# Patient Record
Sex: Female | Born: 1970 | Race: Black or African American | Hispanic: No | Marital: Married | Smoking: Never smoker
Health system: Southern US, Community
[De-identification: ages and names within clinical notes are randomized; demographics above are authoritative.]

## PROBLEM LIST (undated history)

## (undated) DIAGNOSIS — K604 Rectal fistula, unspecified: Secondary | ICD-10-CM

## (undated) DIAGNOSIS — K649 Unspecified hemorrhoids: Secondary | ICD-10-CM

## (undated) DIAGNOSIS — R519 Headache, unspecified: Secondary | ICD-10-CM

## (undated) DIAGNOSIS — F419 Anxiety disorder, unspecified: Secondary | ICD-10-CM

## (undated) DIAGNOSIS — G8929 Other chronic pain: Secondary | ICD-10-CM

## (undated) HISTORY — DX: Anxiety disorder, unspecified: F41.9

## (undated) HISTORY — DX: Headache, unspecified: R51.9

## (undated) HISTORY — DX: Other chronic pain: G89.29

## (undated) HISTORY — DX: Rectal fistula, unspecified: K60.40

## (undated) HISTORY — DX: Unspecified hemorrhoids: K64.9

---

## 1999-04-08 ENCOUNTER — Other Ambulatory Visit: Admission: RE | Admit: 1999-04-08 | Discharge: 1999-04-08 | Payer: Self-pay | Admitting: Obstetrics and Gynecology

## 2000-06-14 ENCOUNTER — Other Ambulatory Visit: Admission: RE | Admit: 2000-06-14 | Discharge: 2000-06-14 | Payer: Self-pay | Admitting: Obstetrics and Gynecology

## 2001-05-28 ENCOUNTER — Other Ambulatory Visit: Admission: RE | Admit: 2001-05-28 | Discharge: 2001-05-28 | Payer: Self-pay | Admitting: Obstetrics and Gynecology

## 2002-01-10 ENCOUNTER — Inpatient Hospital Stay (HOSPITAL_COMMUNITY): Admission: AD | Admit: 2002-01-10 | Discharge: 2002-01-14 | Payer: Self-pay | Admitting: Gynecology

## 2002-01-11 ENCOUNTER — Encounter (INDEPENDENT_AMBULATORY_CARE_PROVIDER_SITE_OTHER): Payer: Self-pay | Admitting: Specialist

## 2002-02-22 ENCOUNTER — Other Ambulatory Visit: Admission: RE | Admit: 2002-02-22 | Discharge: 2002-02-22 | Payer: Self-pay | Admitting: Gynecology

## 2003-03-18 ENCOUNTER — Other Ambulatory Visit: Admission: RE | Admit: 2003-03-18 | Discharge: 2003-03-18 | Payer: Self-pay | Admitting: Obstetrics and Gynecology

## 2004-04-14 ENCOUNTER — Other Ambulatory Visit: Admission: RE | Admit: 2004-04-14 | Discharge: 2004-04-14 | Payer: Self-pay | Admitting: Obstetrics and Gynecology

## 2005-01-17 ENCOUNTER — Ambulatory Visit (HOSPITAL_COMMUNITY): Admission: RE | Admit: 2005-01-17 | Discharge: 2005-01-17 | Payer: Self-pay | Admitting: Obstetrics and Gynecology

## 2005-01-17 IMAGING — US US OB COMP LESS 14 WK
1 series · 14 of 28 positions shown · non-contrast
Comparison: none

CLINICAL DATA: 7 week 4 day gestational age by LMP.  Vaginal bleeding.  
 OBSTETRICAL ULTRASOUND <14 WKS AND TRANSVAGINAL OB US:
TECHNIQUE: Both transabdominal and transvaginal ultrasound examinations were performed for complete evaluation of the gestation as well as the maternal uterus, adnexal regions, and pelvic cul-de-sac.
 A single living intrauterine gestation is seen with measured heart rate of 163.  Embryonic crown rump length measures 1.6 cm, corresponding with a gestational age of 7 weeks 6 days.  A normal appearing yolk sac is seen.  There is no evidence of subchorionic hemorrhage.  No fibroids or other maternal uterine abnormalities are identified.  
 The right ovary is normal in appearance.  The left ovary contains a small corpus luteum cyst measuring approximately 2 cm.  There is also a simple left paraovarian cyst measuring 2.2 cm and a small amount of free fluid.

[Series 1: us ob comp less 14 wk · 0.29mm/px · 14 of 39 slices shown]
[im 2/39]
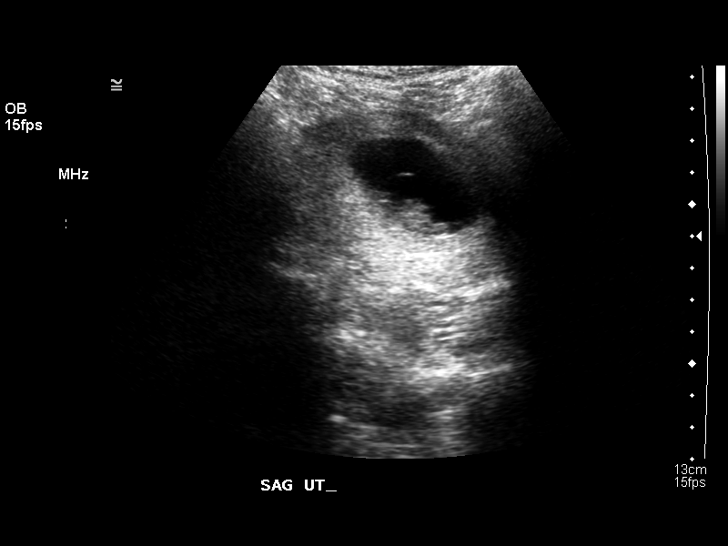
[im 5/39]
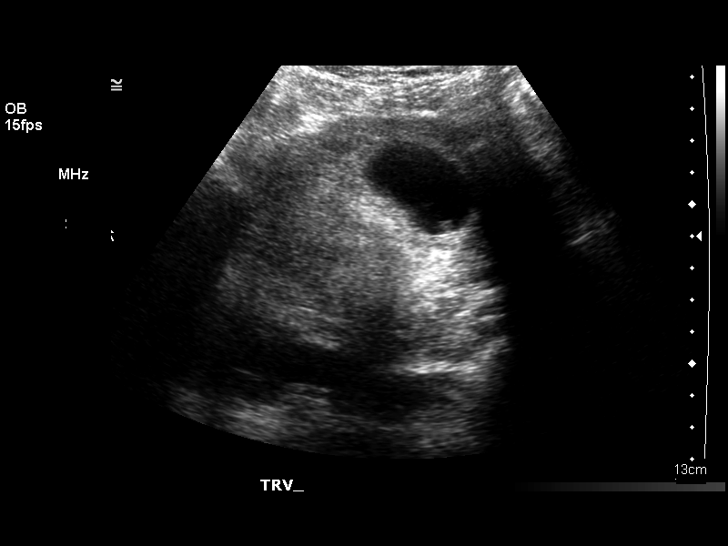
[im 8/39]
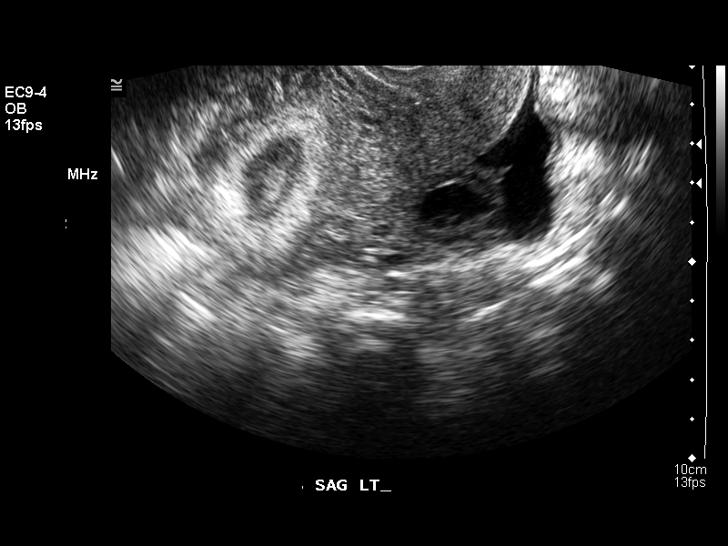
[im 10/39]
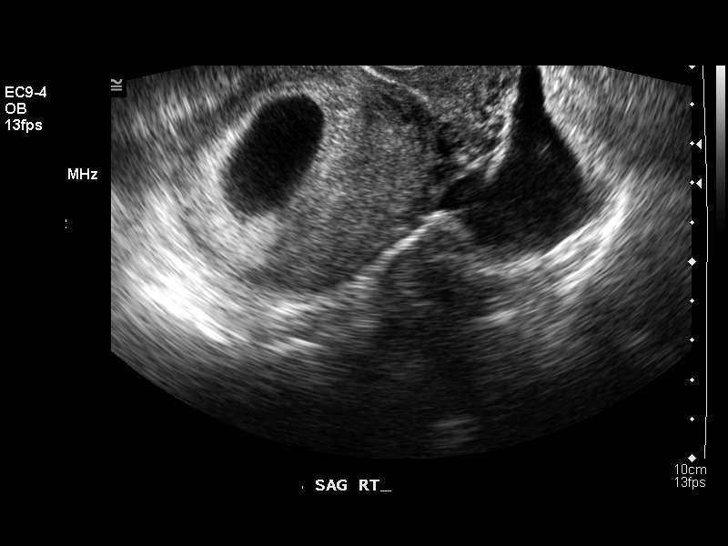
[im 13/39]
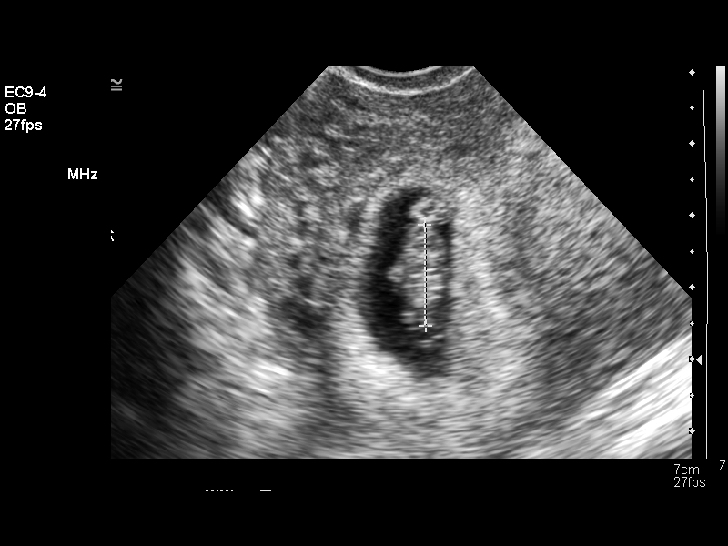
[im 16/39]
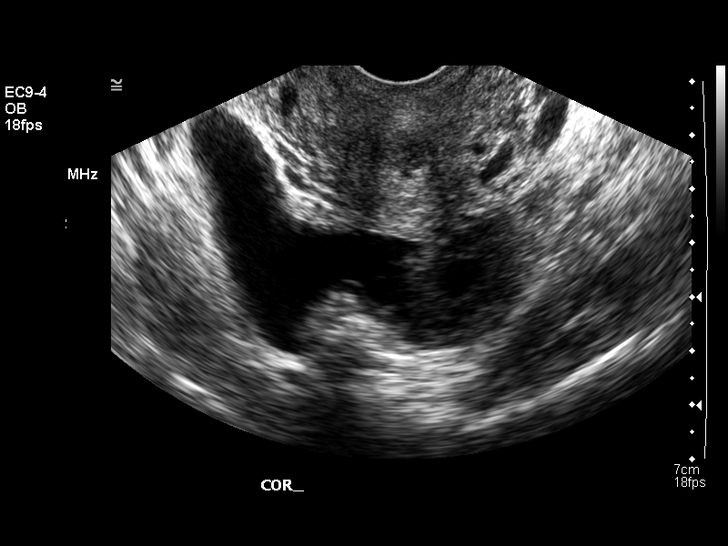
[im 19/39]
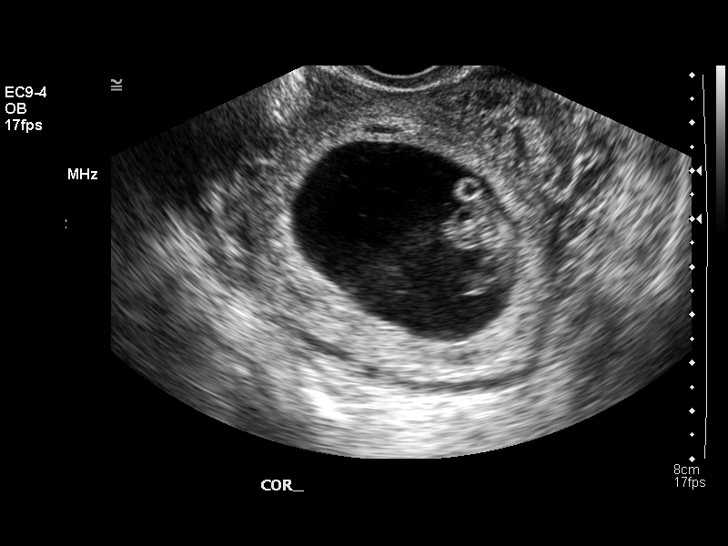
[im 22/39]
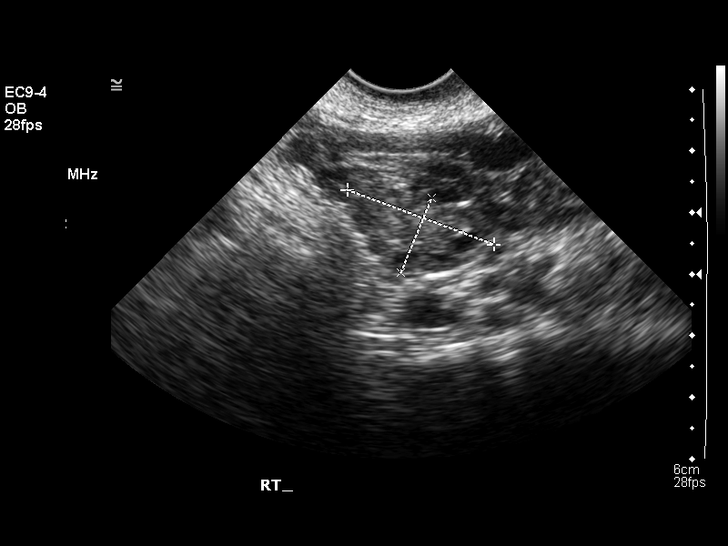
[im 24/39]
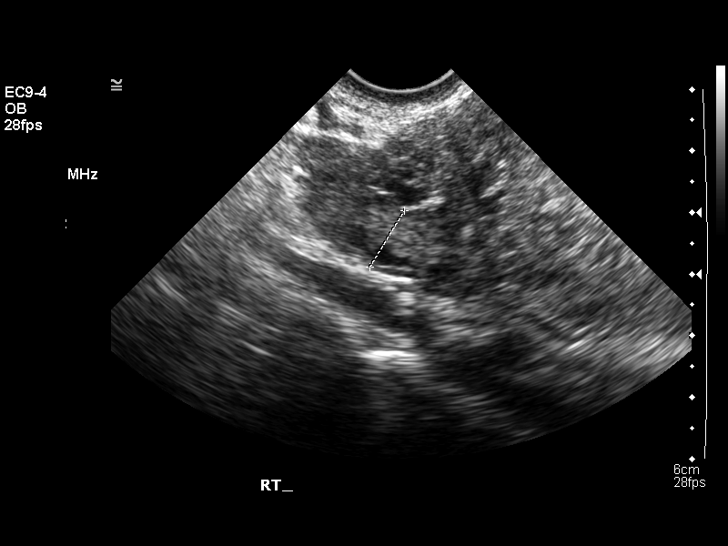
[im 27/39]
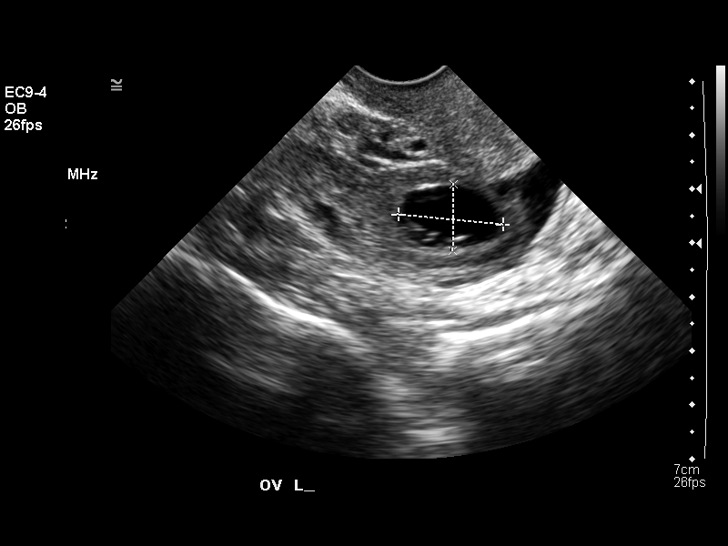
[im 30/39]
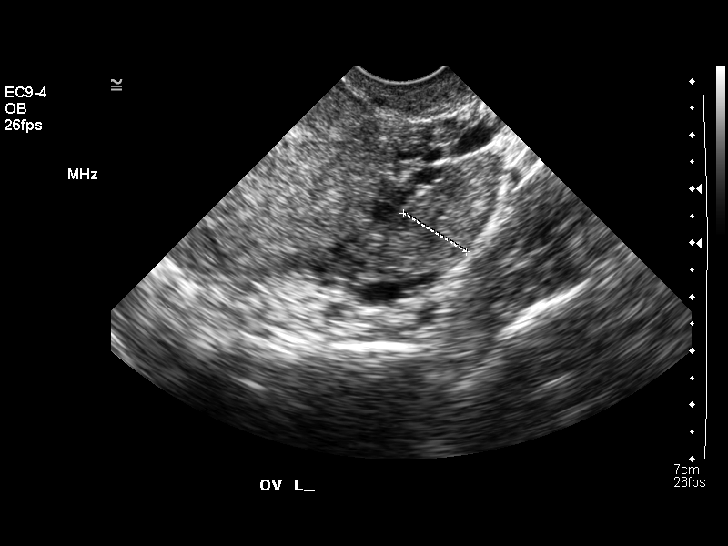
[im 33/39]
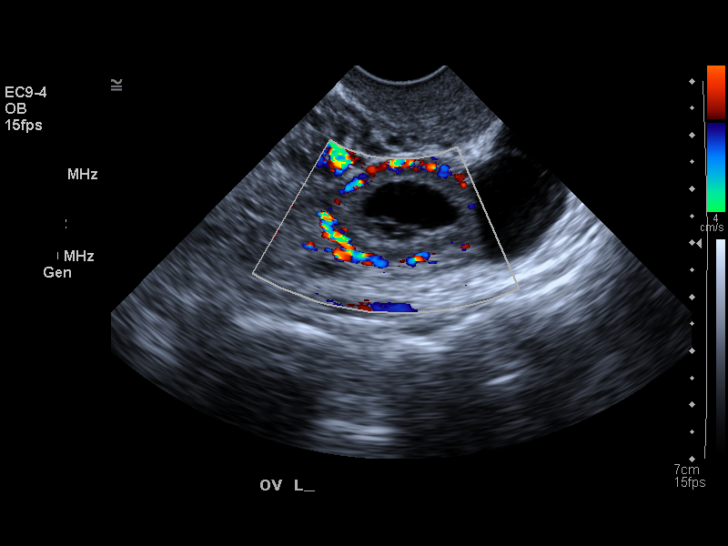
[im 36/39]
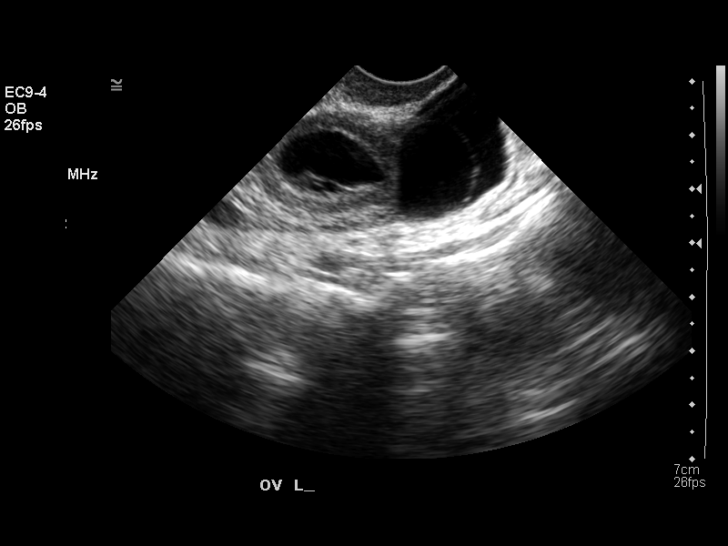
[im 39/39]
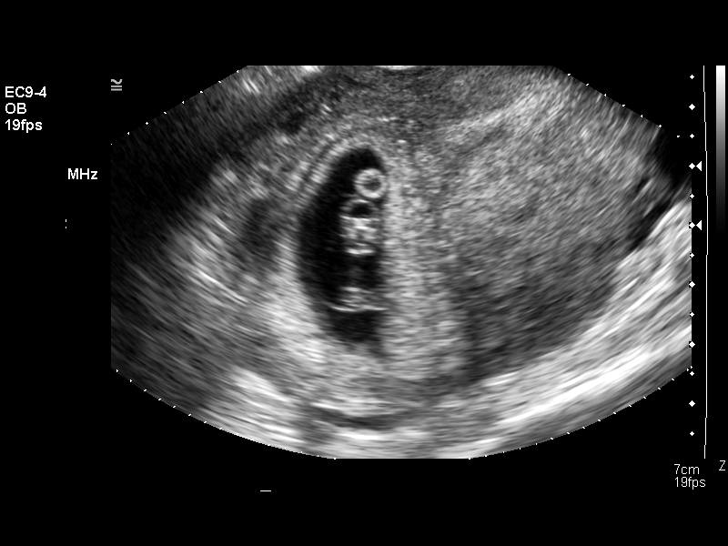

[14 of 28 positions shown; findings below may reference images not displayed]

IMPRESSION: 1.  Single living intrauterine gestation with estimated gestational age of 7 weeks [LD] and sonographic EDC of [DATE]
 2.   2 cm left ovarian corpus luteum cyst and 2.2 cm left paraovarian cyst.   
 3.   Normal appearance of the right ovary.

## 2005-02-07 ENCOUNTER — Other Ambulatory Visit: Admission: RE | Admit: 2005-02-07 | Discharge: 2005-02-07 | Payer: Self-pay | Admitting: Gynecology

## 2005-08-25 ENCOUNTER — Inpatient Hospital Stay (HOSPITAL_COMMUNITY): Admission: RE | Admit: 2005-08-25 | Discharge: 2005-08-28 | Payer: Self-pay | Admitting: Gynecology

## 2005-10-19 ENCOUNTER — Other Ambulatory Visit: Admission: RE | Admit: 2005-10-19 | Discharge: 2005-10-19 | Payer: Self-pay | Admitting: Gynecology

## 2007-01-15 ENCOUNTER — Other Ambulatory Visit: Admission: RE | Admit: 2007-01-15 | Discharge: 2007-01-15 | Payer: Self-pay | Admitting: Obstetrics and Gynecology

## 2008-01-17 ENCOUNTER — Encounter: Payer: Self-pay | Admitting: Obstetrics and Gynecology

## 2008-01-17 ENCOUNTER — Ambulatory Visit: Payer: Self-pay | Admitting: Obstetrics and Gynecology

## 2008-01-17 ENCOUNTER — Other Ambulatory Visit: Admission: RE | Admit: 2008-01-17 | Discharge: 2008-01-17 | Payer: Self-pay | Admitting: Obstetrics and Gynecology

## 2009-01-19 ENCOUNTER — Encounter: Payer: Self-pay | Admitting: Obstetrics and Gynecology

## 2009-01-19 ENCOUNTER — Other Ambulatory Visit: Admission: RE | Admit: 2009-01-19 | Discharge: 2009-01-19 | Payer: Self-pay | Admitting: Obstetrics and Gynecology

## 2009-01-19 ENCOUNTER — Ambulatory Visit: Payer: Self-pay | Admitting: Obstetrics and Gynecology

## 2010-01-20 ENCOUNTER — Ambulatory Visit: Payer: Self-pay | Admitting: Obstetrics and Gynecology

## 2010-01-20 ENCOUNTER — Other Ambulatory Visit: Admission: RE | Admit: 2010-01-20 | Discharge: 2010-01-20 | Payer: Self-pay | Admitting: Obstetrics and Gynecology

## 2010-03-24 ENCOUNTER — Encounter
Admission: RE | Admit: 2010-03-24 | Discharge: 2010-03-24 | Payer: Self-pay | Source: Home / Self Care | Attending: Obstetrics and Gynecology | Admitting: Obstetrics and Gynecology

## 2010-07-23 NOTE — Op Note (Signed)
NAME:  Natalie Meyer, Natalie Meyer                     ACCOUNT NO.:  [id number]   MEDICAL RECORD NO.:  [id number]                   PATIENT TYPE:  INP   LOCATION:  9199                                 [facility]ACILITY:  [facility]   PHYSICIAN:  Katy Fitch, M.D.               DATE OF BIRTH:  September 01, 1970   DATE OF PROCEDURE:  01/11/2002  DATE OF DISCHARGE:                                 OPERATIVE REPORT   PREOPERATIVE DIAGNOSES:  1. Forty-one week intrauterine pregnancy.  2. Repetitive bradycardia.   POSTOPERATIVE DIAGNOSES:  1. Forty-one week intrauterine pregnancy.  2. Repetitive bradycardia.   PROCEDURE:  Stat primary low transverse cesarean section.   SURGEON:  Katy Fitch, M.D.   ASSISTANT:  Scrub technician.   ANESTHESIA:  Spinal.   ESTIMATED BLOOD LOSS:  600.   URINE OUTPUT:  225 and clear.   FLUIDS REPLACED:  2 L crystalloid.   COMPLICATIONS:  None.   SPECIMENS:  1. Cord pH.  2. Cord blood.  3. Placenta.   FINDINGS:  A living 8 pound 14 ounce female in the occiput posterior position  with Apgars of 9 and 9, with three-vessel cord.  Normal-appearing uterus,  tubes, and ovaries.  Cord pH noted to be 7.13.   DESCRIPTION OF PROCEDURE:  The patient was taken to the operating room after  noted to have a 12-minute bradycardia into the 70s without recovery in labor  room despite positioning, oxygenation, and checking for cord.  The patient  was taken back to the operating room in an emergent fashion.  The patient  had spinal anesthesia administered.  The patient was then prepped and draped  in a sterile fashion.  A skin incision was made, Pfannenstiel fashion, and  was taken down to the fascia and incised.  The fascia was then extended  laterally digitally.  the rectus muscles were then separated in the midline  digitally.  The peritoneum was then entered bluntly using fingers.  A  bladder blade was then placed into the pelvis.  The vesicouterine peritoneum  was identified and  incised with Metzenbaum scissors, a bladder flap was  created digitally, and then the bladder blade was placed back in.  A low  transverse uterine incision was made with a scalpel and extended manually.  The infant's head was then delivered atraumatically along with the rest of  the body.  The mouth and nose were suctioned, the cord was clamped and cut,  and the infant handed off to the waiting NICU attendants.  Cord pH was  obtained along with cord blood, and the placenta was massaged from the  uterus and sent for pathology.  The uterus was then exteriorized and cleared  of all clots and debris.  A running 1-0 chromic interlocking stitch was used  to close the lower uterine segment.  A second imbricating stitch was then  used to make the incision hemostatic.  Pressure was placed on the incision,  and the posterior cul-de-sac was irrigated of all clots and debris with warm  normal saline.  The uterus was then returned to the pelvis.  The cul-de-sacs  were then irrigated and removed of all clots and debris.  The incision was  noted to be hemostatic.  Subfascial inspection of the incision was noted to  be hemostatic.  The rectus muscles were reapproximated with one interrupted  stitch of 1 chromic.  The fascia was then reapproximated using 0 Vicryl in a  running continuous stitch.  The subcutaneous tissue was irrigated and Bovie  cautery of several areas of bleeding.  The skin was then reapproximated  using staples.  The patient was taken to the recovery room in stable  condition.                                               Katy Fitch, M.D.    DC/MEDQ  D:  01/11/2002  T:  01/11/2002  Job:  [id number]

## 2010-07-23 NOTE — Discharge Summary (Signed)
   NAME:  Natalie, Meyer                     ACCOUNT NO.:  [id number]   MEDICAL RECORD NO.:  [id number]                   PATIENT TYPE:  INP   LOCATION:  9132                                 [facility]ILITY:  [facility]   PHYSICIAN:  Ivor Costa. Farrel Gobble, M.D.              DATE OF BIRTH:  31-Dec-1970   DATE OF ADMISSION:  01/11/2002  DATE OF DISCHARGE:  01/14/2002                                 DISCHARGE SUMMARY   DISCHARGE DIAGNOSES:  1. Intrauterine pregnancy 41 weeks.  2. Repetitive bradycardia.   PROCEDURE:  Stat primary low transverse cesarean section with delivery of  viable infant.   HISTORY OF PRESENT ILLNESS:  The patient is a 40 year old primigravida with  an LMP of March 30, 2001, [facility] January 04, 2002.  Prenatal course was  uncomplicated.  Laboratories:  Blood type A+.  Antibody screen negative.  Sickle cell negative.  RPR, HBSAG, HIV nonreactive.   HOSPITAL COURSE:  The patient presented at 41 weeks for induction of labor  secondary to post dates.  Labor was initiated with Cervidil followed by high  dose Pitocin.  The patient did develop repetitive bradycardia during the  course of her labor.  Delivery was accomplished by stat primary low  transverse cesarean section with delivery of viable 8 pound 14 ounce female,  Apgars 9/9.  Normal appearing uterus, tubes, and ovaries.  Cord pH 7.13.  Postoperative course patient remained afebrile.  Had no difficulty voiding.  Was able to be discharged in satisfactory condition on her third  postoperative day.  CBC:  Hematocrit 26, hemoglobin 9, WBC 12.5, platelets  161,000.   DISPOSITION:  Follow up six weeks.  Prenatal vitamins and iron.  Tylox for  pain.     [name], N.P.                Ivor Costa. Farrel Gobble, M.D.    MKH/MEDQ  D:  02/05/2002  T:  02/05/2002  Job:  [id number]

## 2010-07-23 NOTE — H&P (Signed)
   NAME:  Natalie Meyer, HUERTAS                     ACCOUNT NO.:  [id number]   MEDICAL RECORD NO.:  [id number]                   PATIENT TYPE:   LOCATION:                                       [facility]   PHYSICIAN:  Ivor Costa. Farrel Gobble, M.D.              DATE OF BIRTH:  April 16, 1970   DATE OF ADMISSION:  01/10/2002  DATE OF DISCHARGE:                                HISTORY & PHYSICAL   CHIEF COMPLAINT:  At 41-1/7 weeks pregnancy.   HISTORY OF PRESENT ILLNESS:  The patient is a 40 year old G1 with an LMP of  March 30, 2001, estimated date of confinement of January 04, 2002,  estimated gestational age of 41-1/7 weeks who presents to labor and delivery  for elective induction of labor for postdates pregnancy.  Pregnancy has  otherwise been without complication.  The patient reports good fetal  movement, no vaginal bleeding and no loss of fluid.  She is A positive,  antibody negative, RPR nonreactive, rubella immune, hepatitis B surface  antigen nonreactive, HIV nonreactive, GBS negative.  Pregnancy is confirmed  by an 8-week ultrasound.   PAST OBSTETRICAL/GYNECOLOGICAL HISTORY:  Unremarkable with normal 28-day  cycles, normal Pap smear.   MEDICAL HISTORY:  Negative.   SURGICAL HISTORY:  Negative.   ALLERGIES:  Negative.   SOCIAL HISTORY:  She is married; no alcohol or tobacco.   PHYSICAL EXAMINATION:  GENERAL: She is a gravid female in no acute distress.  VITAL SIGNS: Blood pressure is 116/70.  Weight gain is 50 pounds.  HEENT: Unremarkable.  NECK: Thyroid smooth, nontender, and mobile.  HEART: Regular rate.  LUNGS: Clear to auscultation.  BREASTS: Without mass, discharge, or retraction.  ABDOMEN: Gravid, nontender.  Fetal heart tones are auscultated.  VAGINAL: She was 1, 70, and -2.  EXTREMITIES: Trace edema.   ASSESSMENT:  Postdates pregnancy for elective induction.   PLAN:  The patient will present the evening of January 10, 2002 for Cervidil  with a AROM and Pitocin  in the morning.                                               Ivor Costa. Farrel Gobble, M.D.   THL/MEDQ  D:  01/07/2002  T:  01/07/2002  Job:  [id number]

## 2010-07-23 NOTE — H&P (Signed)
NAME:  Natalie Meyer, Natalie Meyer           ACCOUNT NO.:  [id number]   MEDICAL RECORD NO.:  [id number]          PATIENT TYPE:  INP   LOCATION:  NA                            [facility]   PHYSICIAN:  Ivor Costa. Farrel Gobble, M.D. DATE OF BIRTH:  [birth year]   DATE OF ADMISSION:  08/25/2005  DATE OF DISCHARGE:                                HISTORY & PHYSICAL   HISTORY OF PRESENT ILLNESS:  Patient is a 40 year old G2, P8 with an LMP of  November 25, 2004.  Estimated date of confinement of September 01, 2005.  Estimated gestational age of 39-0/7 weeks, who presents now for an elective  repeat cesarean section.  Patient reports good fetal movement, no vaginal  bleeding.  Without any complaints.  The patient previously had a cesarean  section done for fetal distress.  The pregnancy was unremarkable.  She  reports good fetal movement.  No vaginal bleeding.  No contractions.  She is  A+, antibody negative.  RPR nonreactive.  Rubella immune.  Hepatitis B  surface antigen nonreactive.  HIV nonreactive.  GBS negative.  Refer to the  [facility].   PHYSICAL EXAMINATION:  GENERAL:  She is a well-appearing female in no acute  distress.  LUNGS:  Clear to auscultation.  HEART:  Regular rate.  ABDOMEN:  Soft.  Nontender.  Gravid.  Fundal height 36.  Heart tones were  auscultated.  PELVIC:  Vaginal exam was deferred, as the patient was not having  contractions.  She had been long, closed, and posterior.  EXTREMITIES:  Negative for edema.   ASSESSMENT:  Term pregnancy for an elective repeat cesarean section.   All questions were addressed.  The patient was given a prescription for  Tylox 1-2 q.6h. p.r.n. pain for postoperative pain management.  She will  present in the morning of the 21st for C-section.      Ivor Costa. Farrel Gobble, M.D.  Electronically Signed     THL/MEDQ  D:  08/22/2005  T:  08/22/2005  Job:  [id number]

## 2010-07-23 NOTE — Discharge Summary (Signed)
NAME:  Natalie Meyer, Natalie Meyer           ACCOUNT NO.:  [id number]   MEDICAL RECORD NO.:  [id number]          PATIENT TYPE:  INP   LOCATION:  9106                          [facility]   PHYSICIAN:  Ivor Costa. Farrel Gobble, M.D. DATE OF BIRTH:  12-10-1970   DATE OF ADMISSION:  08/25/2005  DATE OF DISCHARGE:  08/28/2005                                 DISCHARGE SUMMARY   PRINCIPAL DIAGNOSIS:  Term pregnancy.   PRINCIPAL PROCEDURE:  An elective repeat cesarean section.  Refer to the  dictated H&P.   The patient presented on the morning of June 21 and underwent an elective  repeat cesarean section under spinal anesthesia for delivery of a viable  female, birth weight 9 pounds, Apgars 9/9, normal uterus, tubes and ovaries.  Her postoperative course was unremarkable.  The patient elected to bottle  feed, she remained afebrile and her vitals were stable throughout.  The  morning of postop day #3, the patient was ready for discharge, tolerating  pain medication orally.   POSTOP CONDITION:  Stable.   POSTOP LABS:  Her hemoglobin was 10, her hematocrit was 29.4, white count  was 12.6 and platelets of 151.   POSTOP MEDICATIONS:  1.  The patient was given Tylox preoperatively for postoperative pain      management.  2.  She will also use over-the-counter Motrin as needed.      Ivor Costa. Farrel Gobble, M.D.  Electronically Signed     THL/MEDQ  D:  08/28/2005  T:  08/28/2005  Job:  [id number]

## 2010-07-23 NOTE — Op Note (Signed)
NAME:  Natalie Meyer, Natalie Meyer           ACCOUNT NO.:  [id number]   MEDICAL RECORD NO.:  [id number]          PATIENT TYPE:  INP   LOCATION:  NA                            [facility]   PHYSICIAN:  Ivor Costa. Farrel Gobble, M.D. DATE OF BIRTH:  08-20-1970   DATE OF PROCEDURE:  08/25/2005  DATE OF DISCHARGE:                                 OPERATIVE REPORT   PREOPERATIVE DIAGNOSES:  1.  Previous cesarean section.  2.  Term intrauterine pregnancy.   POSTOPERATIVE DIAGNOSES:  1.  Previous cesarean section.  2.  Term intrauterine pregnancy.   PROCEDURE:  Elective repeat cesarean section.   SURGEON:  Ivor Costa. Farrel Gobble, M.D.   ASSISTANT:  Gaetano Hawthorne. Lily Peer, M.D.   ANESTHESIA:  Spinal.   IV FLUIDS:  3500 mL of lactated Ringer's.   ESTIMATED BLOOD LOSS:  500 mL.   URINE OUTPUT:  500 mL of clear urine.   FINDINGS:  A viable female in vertex presentation.  Clear amniotic fluid,  Apgars 9/9, birth weight 9 pounds.  Normal uterus, tubes and ovaries.   COMPLICATIONS:  None.   PATHOLOGY:  None.   SPECIMEN:  Placenta for cord blood banking, public.   PROCEDURE:  The patient was taken to operating room, where spinal anesthesia  was used, and placed in the supine position with left lateral displacement,  prepped and draped in the usual sterile fashion.  After adequate anesthesia  was inserted, a Pfannenstiel skin incision was made with the scalpel going  through the previous C-section scar and carried to the underlying layer of  fascia with the Bovie.  The fascia was scored in the midline and the  incision was extended laterally with Bovie.  The inferior aspect of the  fascial incision was grasped with Kochers, underlying rectus muscles were  dissected off by blunt sharp dissection.  In a similar fashion, the superior  aspect of the incision was grasped with Kochers, the underlying rectus  muscles were dissected off.  The rectus muscles were naturally separated in  the midline.  The peritoneum  was identified and entered bluntly.  The  peritoneal incision was then extended superiorly and inferiorly for  visualization of the underlying bowel and bladder.  The bladder blade was  inserted.  The vesicouterine peritoneum was identified, tented up and  entered sharply with the Metzenbaums, and the incision was extended  laterally.  The bladder flap was created digitally.  The bladder blade was  then reinserted and the lower uterine segment incised in a transverse  fashion with the scalpel.  The clear amniotic fluid was noted upon entering  the cavity.  The infant was delivered from the vertex presentation with the  aid of baby Elliott's, the cord was cut and clamped, handed off to awaiting  pediatricians.  Cord bloods were obtained.  The placenta was then allowed to  separate naturally and sent for cord blood banking.  The uterus was cleared  of all clots and debris.  The uterine incision was repaired with a running  locked layer of 0 chromic and a second suture was used for imbrication.  The  pelvis  was then irrigated with copious amounts of warm saline.  The adnexa  were inspected and noted to be unremarkable.  The incision was felt to be  hemostatic.  The muscle, peritoneum and fascia were also felt to be  hemostatic.  The fascia was then closed with 0 Vicryl in running fashion.  The subcu was irrigated and treated where appropriate with cautery and  reapproximated with 3-0 plain.  The skin was closed with 4-0 Vicryl on a  Mellody Dance, followed by Steri-Strips.  The patient tolerated the procedure well.  Sponge, lap and needle counts were correct x2.  She was transferred to the  PACU in stable condition.      Ivor Costa. Farrel Gobble, M.D.  Electronically Signed     THL/MEDQ  D:  08/25/2005  T:  08/25/2005  Job:  [id number]

## 2010-11-10 ENCOUNTER — Other Ambulatory Visit: Payer: Self-pay | Admitting: Obstetrics and Gynecology

## 2011-01-25 ENCOUNTER — Encounter: Payer: Self-pay | Admitting: *Deleted

## 2011-02-03 ENCOUNTER — Ambulatory Visit (INDEPENDENT_AMBULATORY_CARE_PROVIDER_SITE_OTHER): Payer: Managed Care, Other (non HMO) | Admitting: Obstetrics and Gynecology

## 2011-02-03 ENCOUNTER — Other Ambulatory Visit (HOSPITAL_COMMUNITY)
Admission: RE | Admit: 2011-02-03 | Discharge: 2011-02-03 | Disposition: A | Discharge status: Home / Self Care | Payer: Managed Care, Other (non HMO) | Source: Ambulatory Visit | Attending: Obstetrics and Gynecology | Admitting: Obstetrics and Gynecology

## 2011-02-03 ENCOUNTER — Encounter: Payer: Self-pay | Admitting: Obstetrics and Gynecology

## 2011-02-03 VITALS — BP 110/60 | Ht 70.5 in | Wt 152.0 lb

## 2011-02-03 DIAGNOSIS — Z01419 Encounter for gynecological examination (general) (routine) without abnormal findings: Secondary | ICD-10-CM

## 2011-02-03 DIAGNOSIS — K649 Unspecified hemorrhoids: Secondary | ICD-10-CM | POA: Insufficient documentation

## 2011-02-03 DIAGNOSIS — R823 Hemoglobinuria: Secondary | ICD-10-CM

## 2011-02-03 NOTE — Progress Notes (Signed)
Patient came to see me today for her annual GYN exam. She remains on birth control pills and is very happy on them. She is having regular cycles. She continues to have trouble with libido. She's always had a relatively low libido but it's been nonexistent on oral contraceptives or an estrogen birth control patch. We attempted to insert a Mirena IUD unsuccessfully several years ago. She is also having trouble with her hemorrhoids causing itching and irritation but not bleeding. She is having no pelvic pain. She does not desire more children. She has a PCP who does her lab work.  Physical examination:  Kennon Portela present. HEENT within normal limits. Neck: Thyroid not large. No masses. Supraclavicular nodes: not enlarged. Breasts: Examined in both sitting midline position. No skin changes and no masses. Abdomen: Soft no guarding rebound or masses or hernia. Pelvic: External: Within normal limits. BUS: Within normal limits. Vaginal:within normal limits. Good estrogen effect. No evidence of cystocele rectocele or enterocele. Cervix: clean. Uterus: Normal size and shape. Adnexa: No masses. Rectovaginal exam: Confirmatory and negative. Extremities: Within normal limits.  Assessment: #1. Diminished libido #2. Hemorrhoids  Plan: Patient to stop birth control pills for one month to see if libido improves. She will use condoms for birth control. If it is better we'll consider vasectomy, laparoscopic sterilization, or another attempt of inserting Mirena IUD under ultrasonic guidance. If she is no better from a libido point of view we will consider checking estrogen and testosterone levels.

## 2011-02-03 NOTE — Progress Notes (Signed)
Addended byCammie Mcgee T on: 02/03/2011 02:39 PM   Modules accepted: Orders

## 2011-03-29 ENCOUNTER — Encounter: Payer: Self-pay | Admitting: *Deleted

## 2011-03-29 NOTE — Progress Notes (Signed)
Patient ID: Natalie Meyer, female   DOB: [birth year], 41 y.o.   MRN: [id number] Pt called said she would like to talk with Dr. Reece Agar about trying depo- provera shot rather than the tri-previfem pill she is on now. Pt instructed to make OV.

## 2011-04-12 ENCOUNTER — Telehealth: Payer: Self-pay | Admitting: *Deleted

## 2011-04-12 MED ORDER — NORGESTIM-ETH ESTRAD TRIPHASIC 0.18/0.215/0.25 MG-35 MCG PO TABS: 1.0000 | ORAL_TABLET | Freq: Every day | ORAL | Status: DC

## 2011-04-12 NOTE — Telephone Encounter (Signed)
Pt called wanting refill on birth control tri-previefew, rx sent to pharmacy

## 2011-12-26 ENCOUNTER — Other Ambulatory Visit: Payer: Self-pay | Admitting: Obstetrics and Gynecology

## 2011-12-27 ENCOUNTER — Other Ambulatory Visit: Payer: Self-pay | Admitting: *Deleted

## 2011-12-27 MED ORDER — NORGESTIM-ETH ESTRAD TRIPHASIC 0.18/0.215/0.25 MG-35 MCG PO TABS: 1.0000 | ORAL_TABLET | Freq: Every day | ORAL | Status: DC

## 2011-12-27 NOTE — Telephone Encounter (Addendum)
Pt called requesting refill on birth control, 2 pack sent. No refills

## 2012-02-08 ENCOUNTER — Encounter: Payer: Self-pay | Admitting: Obstetrics and Gynecology

## 2012-02-08 ENCOUNTER — Ambulatory Visit (INDEPENDENT_AMBULATORY_CARE_PROVIDER_SITE_OTHER): Payer: Managed Care, Other (non HMO) | Admitting: Obstetrics and Gynecology

## 2012-02-08 VITALS — BP 120/76 | Ht 70.0 in | Wt 150.0 lb

## 2012-02-08 DIAGNOSIS — Z01419 Encounter for gynecological examination (general) (routine) without abnormal findings: Secondary | ICD-10-CM

## 2012-02-08 LAB — HEMOGLOBIN A1C
Hgb A1c MFr Bld: 5.7 % — ABNORMAL HIGH (ref <5.7)
Mean Plasma Glucose: 117 mg/dL — ABNORMAL HIGH (ref <117)

## 2012-02-08 LAB — CBC WITH DIFFERENTIAL/PLATELET
Basophils Absolute: 0 10*3/uL (ref 0.0–0.1)
Basophils Relative: 1 % (ref 0–1)
Eosinophils Absolute: 0 10*3/uL (ref 0.0–0.7)
Eosinophils Relative: 0 % (ref 0–5)
Hemoglobin: 12.6 g/dL (ref 12.0–15.0)
Lymphocytes Relative: 23 % (ref 12–46)
Lymphs Abs: 1.2 10*3/uL (ref 0.7–4.0)
MCH: 29.6 pg (ref 26.0–34.0)
MCHC: 33.2 g/dL (ref 30.0–36.0)
Monocytes Relative: 5 % (ref 3–12)
Neutro Abs: 3.8 10*3/uL (ref 1.7–7.7)
Neutrophils Relative %: 71 % (ref 43–77)
Platelets: 202 10*3/uL (ref 150–400)
RBC: 4.26 MIL/uL (ref 3.87–5.11)
RDW: 13 % (ref 11.5–15.5)

## 2012-02-08 LAB — CHOLESTEROL, TOTAL: Cholesterol: 137 mg/dL (ref 0–200)

## 2012-02-08 LAB — HDL CHOLESTEROL: HDL: 63 mg/dL (ref >39)

## 2012-02-08 MED ORDER — NORGESTIM-ETH ESTRAD TRIPHASIC 0.18/0.215/0.25 MG-35 MCG PO TABS: 1.0000 | ORAL_TABLET | Freq: Every day | ORAL | Status: DC

## 2012-02-08 NOTE — Patient Instructions (Signed)
Make an appointment with Dr. Earlene Plater. Mammogram at age 41. Have husband see Dr. Ezzie Dural  for vasectomy.

## 2012-02-08 NOTE — Progress Notes (Signed)
Patient came to see me today for her annual GYN exam. She is having regular cycles on her birth control pills. She is having no breakthrough bleeding or pelvic pain. She had a mammogram last year. They recommended followup at 42. She has always had normal pap smears. Her last Pap smear was 2012. She would like to do something else for birth control. The problem with her pills if she has trouble remembering them. We attempted in 2007 insert a Mirena IUD. We had difficulty doing so because of the position of her uterus, cervical stenosis, and 2 previous cesarean sections. They do  not want to have anymore children. Patient has had previous workup of her microscopic hematuria with Dr. Darvin Neighbours.Last year she had 5-8 red blood cells per high-power field.  Physical examination:Kim Julian Reil present. HEENT within normal limits. Neck: Thyroid not large. No masses. Supraclavicular nodes: not enlarged. Breasts: Examined in both sitting and lying  position. No skin changes and no masses. Abdomen: Soft no guarding rebound or masses or hernia. Pelvic: External: Within normal limits. BUS: Within normal limits. Vaginal:within normal limits. Good estrogen effect. No evidence of cystocele rectocele or enterocele. Cervix: clean. Uterus: Normal size and shape. Adnexa: No masses. Rectovaginal exam: Confirmatory and negative. Extremities: Within normal limits.  Assessment: Normal GYN exam  Plan: Other birth control options were discussed including reattempting IUD insertion under ultrasonic guidance was either a Mirena IUD or smaller skyla. Also discussed Implanon or Depo-Provera. I think he is ready to have a vasectomy and we will referred him to Dr. Ezzie Dural. Pap not done.The new Pap smear guidelines were discussed with the patient. Patient has a history of microscopic hematuria. It is time for followup with Dr. Darvin Neighbours and she will make an appointment. In the interim before his vasectomy she will continue on her birth  control pills.

## 2012-02-09 LAB — URINALYSIS W MICROSCOPIC + REFLEX CULTURE
Bacteria, UA: NONE SEEN
Casts: NONE SEEN
Crystals: NONE SEEN
Leukocytes, UA: NEGATIVE
Nitrite: NEGATIVE
Protein, ur: NEGATIVE mg/dL
Specific Gravity, Urine: 1.027 (ref 1.005–1.030)
Squamous Epithelial / HPF: NONE SEEN
Urobilinogen, UA: 1 mg/dL (ref 0.0–1.0)

## 2012-02-10 LAB — URINE CULTURE: Colony Count: 9000

## 2012-03-21 ENCOUNTER — Other Ambulatory Visit: Payer: Self-pay | Admitting: Obstetrics and Gynecology

## 2012-08-27 ENCOUNTER — Other Ambulatory Visit: Payer: Self-pay | Admitting: Obstetrics and Gynecology

## 2013-02-13 ENCOUNTER — Ambulatory Visit (INDEPENDENT_AMBULATORY_CARE_PROVIDER_SITE_OTHER): Payer: Managed Care, Other (non HMO) | Admitting: Women's Health

## 2013-02-13 ENCOUNTER — Encounter: Payer: Self-pay | Admitting: Women's Health

## 2013-02-13 ENCOUNTER — Other Ambulatory Visit (HOSPITAL_COMMUNITY)
Admission: RE | Admit: 2013-02-13 | Discharge: 2013-02-13 | Disposition: A | Discharge status: Home / Self Care | Payer: Managed Care, Other (non HMO) | Source: Ambulatory Visit | Attending: Gynecology | Admitting: Gynecology

## 2013-02-13 VITALS — BP 118/72 | Ht 70.0 in | Wt 152.8 lb

## 2013-02-13 DIAGNOSIS — Z01419 Encounter for gynecological examination (general) (routine) without abnormal findings: Secondary | ICD-10-CM

## 2013-02-13 DIAGNOSIS — Z833 Family history of diabetes mellitus: Secondary | ICD-10-CM

## 2013-02-13 DIAGNOSIS — IMO0001 Reserved for inherently not codable concepts without codable children: Secondary | ICD-10-CM

## 2013-02-13 DIAGNOSIS — Z13220 Encounter for screening for lipoid disorders: Secondary | ICD-10-CM

## 2013-02-13 DIAGNOSIS — Z309 Encounter for contraceptive management, unspecified: Secondary | ICD-10-CM

## 2013-02-13 LAB — LIPID PANEL
LDL Cholesterol: 64 mg/dL (ref 0–99)
Total CHOL/HDL Ratio: 2.2 Ratio
VLDL: 9 mg/dL (ref 0–40)

## 2013-02-13 LAB — CBC WITH DIFFERENTIAL/PLATELET
Basophils Relative: 0 % (ref 0–1)
Eosinophils Absolute: 0 10*3/uL (ref 0.0–0.7)
Eosinophils Relative: 1 % (ref 0–5)
HCT: 35.8 % — ABNORMAL LOW (ref 36.0–46.0)
Hemoglobin: 12.3 g/dL (ref 12.0–15.0)
Lymphs Abs: 1 10*3/uL (ref 0.7–4.0)
MCH: 29.9 pg (ref 26.0–34.0)
MCHC: 34.4 g/dL (ref 30.0–36.0)
Monocytes Absolute: 0.3 10*3/uL (ref 0.1–1.0)
Monocytes Relative: 6 % (ref 3–12)
Neutro Abs: 3.2 10*3/uL (ref 1.7–7.7)
Platelets: 207 10*3/uL (ref 150–400)
RDW: 13.2 % (ref 11.5–15.5)

## 2013-02-13 MED ORDER — NORGESTIM-ETH ESTRAD TRIPHASIC 0.18/0.215/0.25 MG-35 MCG PO TABS: ORAL_TABLET | ORAL | Status: DC

## 2013-02-13 NOTE — Patient Instructions (Signed)

## 2013-02-13 NOTE — Progress Notes (Signed)
Natalie Meyer [birth year] [id number]    History:    The patient presents for annual exam.  Light monthly cycle on TriSprintec. Normal Pap and mammogram history.   Past medical history, past surgical history, family history and social history were all reviewed and documented in the EPIC chart. Owns a pre- school. Benign hematuria with negative workup Dr. Earlene Plater. Mother hypertension. Paternal aunt colon cancer. 2 sons ages 45 and 48, both doing well.   ROS:  A  ROS was performed and pertinent positives and negatives are included in the history.  Exam:  Filed Vitals:   02/13/13 0957  BP: 118/72    General appearance:  Normal Head/Neck:  Normal, without cervical or supraclavicular adenopathy. Thyroid:  Symmetrical, normal in size, without palpable masses or nodularity. Respiratory  Effort:  Normal  Auscultation:  Clear without wheezing or rhonchi Cardiovascular  Auscultation:  Regular rate, without rubs, murmurs or gallops  Edema/varicosities:  Not grossly evident Abdominal  Soft,nontender, without masses, guarding or rebound.  Liver/spleen:  No organomegaly noted  Hernia:  None appreciated  Skin  Inspection:  Grossly normal  Palpation:  Grossly normal Neurologic/psychiatric  Orientation:  Normal with appropriate conversation.  Mood/affect:  Normal  Genitourinary    Breasts: Examined lying and sitting.     Right: Without masses, retractions, discharge or axillary adenopathy.     Left: Without masses, retractions, discharge or axillary adenopathy.   Inguinal/mons:  Normal without inguinal adenopathy  External genitalia:  Normal  BUS/Urethra/Skene's glands:  Normal  Bladder:  Normal  Vagina:  Normal  Cervix:  Normal  Uterus:   normal in size, shape and contour.  Midline and mobile  Adnexa/parametria:     Rt: Without masses or tenderness.   Lt: Without masses or tenderness.  Anus and perineum: Normal  Digital rectal exam: Normal sphincter tone without palpated masses  or tenderness  Assessment/Plan:  42 y.o. MBF G2P2 for annual exam with no complaints.  Normal GYN exam on Tri Sprintec  Plan: SBE's, schedule annual mammogram, 3-D tomography reviewed and encouraged history of dense breasts. Regular exercise, calcium rich diet, vitamin D 1000 daily encouraged. TriSprintec prescription, proper use given and reviewed risk for blood clots and strokes, continue until negative sperm count after husbands vasectomy. CBC, glucose, lipid panel, UA, Pap. Pap normal 2012, new screening guidelines reviewed.  Harrington Challenger WHNP, 1:12 PM 02/13/2013

## 2013-02-14 LAB — URINALYSIS W MICROSCOPIC + REFLEX CULTURE
Casts: NONE SEEN
Crystals: NONE SEEN
Glucose, UA: NEGATIVE mg/dL
Leukocytes, UA: NEGATIVE
Nitrite: NEGATIVE
Protein, ur: NEGATIVE mg/dL
Squamous Epithelial / LPF: NONE SEEN
Urobilinogen, UA: 0.2 mg/dL (ref 0.0–1.0)

## 2013-02-15 ENCOUNTER — Other Ambulatory Visit: Payer: Self-pay | Admitting: Gynecology

## 2013-02-15 LAB — URINE CULTURE
Colony Count: NO GROWTH
Organism ID, Bacteria: NO GROWTH

## 2014-01-06 ENCOUNTER — Encounter: Payer: Self-pay | Admitting: Women's Health

## 2014-02-14 ENCOUNTER — Ambulatory Visit (INDEPENDENT_AMBULATORY_CARE_PROVIDER_SITE_OTHER): Payer: Managed Care, Other (non HMO) | Admitting: Women's Health

## 2014-02-14 ENCOUNTER — Encounter: Payer: Self-pay | Admitting: Women's Health

## 2014-02-14 VITALS — BP 128/80 | Ht 70.0 in | Wt 156.0 lb

## 2014-02-14 DIAGNOSIS — Z01419 Encounter for gynecological examination (general) (routine) without abnormal findings: Secondary | ICD-10-CM

## 2014-02-14 DIAGNOSIS — N029 Recurrent and persistent hematuria with unspecified morphologic changes: Secondary | ICD-10-CM

## 2014-02-14 DIAGNOSIS — Z23 Encounter for immunization: Secondary | ICD-10-CM

## 2014-02-14 DIAGNOSIS — Z3041 Encounter for surveillance of contraceptive pills: Secondary | ICD-10-CM

## 2014-02-14 LAB — URINALYSIS W MICROSCOPIC + REFLEX CULTURE
Bilirubin Urine: NEGATIVE
Casts: NONE SEEN
Crystals: NONE SEEN
Glucose, UA: NEGATIVE mg/dL
Ketones, ur: NEGATIVE mg/dL
Leukocytes, UA: NEGATIVE
Nitrite: NEGATIVE
Protein, ur: NEGATIVE mg/dL
Specific Gravity, Urine: 1.02 (ref 1.005–1.030)
Urobilinogen, UA: 1 mg/dL (ref 0.0–1.0)
pH: 6 (ref 5.0–8.0)

## 2014-02-14 MED ORDER — NORETHIN ACE-ETH ESTRAD-FE 1-20 MG-MCG PO TABS: 1.0000 | ORAL_TABLET | Freq: Every day | ORAL | Status: DC

## 2014-02-14 NOTE — Progress Notes (Signed)
Brantley StageStacy N Kirby-Jones 09/05/1970 [id number]    History:    Presents for annual exam.  Monthly cycle on tri-Sprintec. Normal Pap history, mammogram last month in [place], has diagnostic follow-up, history of dense breast. History of benign hematuria Dr. Earlene Plateravis.  Past medical history, past surgical history, family history and social history were all reviewed and documented in the EPIC chart. Owns a preschool. 2 sons ages 568 and 5312 both doing well. Mother hypertension.  ROS:  A  12 point ROS was performed and pertinent positives and negatives are included.  Exam:  Filed Vitals:   02/14/14 1023  BP: 128/80    General appearance:  Normal Thyroid:  Symmetrical, normal in size, without palpable masses or nodularity. Respiratory  Auscultation:  Clear without wheezing or rhonchi Cardiovascular  Auscultation:  Regular rate, without rubs, murmurs or gallops  Edema/varicosities:  Not grossly evident Abdominal  Soft,nontender, without masses, guarding or rebound.  Liver/spleen:  No organomegaly noted  Hernia:  None appreciated  Skin  Inspection:  Grossly normal   Breasts: Examined lying and sitting.     Right: Without masses, retractions, discharge or axillary adenopathy.     Left: Without masses, retractions, discharge or axillary adenopathy. Gentitourinary   Inguinal/mons:  Normal without inguinal adenopathy  External genitalia:  Normal  BUS/Urethra/Skene's glands:  Normal  Vagina:  Stenotic  Cervix:  Normal  Uterus:   normal in size, shape and contour.  Midline and mobile  Adnexa/parametria:     Rt: Without masses or tenderness.   Lt: Without masses or tenderness.  Anus and perineum: Normal  Digital rectal exam: Normal sphincter tone without palpated masses or tenderness  Assessment/Plan:  43 y.o.MBF G2P2  for annual exam with no complaints.  Reviewed importance of keeping scheduled follow-up diagnostic mammogram for completeness of exam Monthly cycle on tri-Sprintec 2010 Benign  hematuria with negative workup by urologist  Plan: Contraception options reviewed, will try Loestrin 1/20 prescription, proper use given and reviewed slight risk for blood clots and strokes finish out current prescription and start. Call if problems with change. SBE's, continue annual mammogram, 3-D tomography reviewed and encouraged history of dense breasts. UA large blood, 3-6 RBCs, we'll continue to watch hematuria, follow-up as needed. Encouraged regular exercise, calcium rich diet, vitamin D 1000 daily. Has scheduled annual exam with primary care in January for labs. Pap normal 2014, new screening guidelines reviewed.Marland Kitchen.    Harrington ChallengerYOUNG,NANCY J WHNP, 11:13 AM 02/14/2014

## 2014-02-14 NOTE — Patient Instructions (Signed)

## 2014-02-16 LAB — URINE CULTURE
Colony Count: NO GROWTH
Organism ID, Bacteria: NO GROWTH

## 2014-03-20 DIAGNOSIS — L309 Dermatitis, unspecified: Secondary | ICD-10-CM | POA: Insufficient documentation

## 2014-03-20 DIAGNOSIS — G44209 Tension-type headache, unspecified, not intractable: Secondary | ICD-10-CM | POA: Insufficient documentation

## 2014-03-20 DIAGNOSIS — F418 Other specified anxiety disorders: Secondary | ICD-10-CM | POA: Insufficient documentation

## 2014-06-19 ENCOUNTER — Ambulatory Visit (INDEPENDENT_AMBULATORY_CARE_PROVIDER_SITE_OTHER): Payer: PRIVATE HEALTH INSURANCE | Admitting: Women's Health

## 2014-06-19 ENCOUNTER — Encounter: Payer: Self-pay | Admitting: Women's Health

## 2014-06-19 ENCOUNTER — Other Ambulatory Visit: Payer: Self-pay | Admitting: Women's Health

## 2014-06-19 ENCOUNTER — Telehealth: Payer: Self-pay

## 2014-06-19 DIAGNOSIS — B3731 Acute candidiasis of vulva and vagina: Secondary | ICD-10-CM

## 2014-06-19 DIAGNOSIS — B373 Candidiasis of vulva and vagina: Secondary | ICD-10-CM | POA: Diagnosis not present

## 2014-06-19 LAB — WET PREP FOR TRICH, YEAST, CLUE
Clue Cells Wet Prep HPF POC: NONE SEEN
Trich, Wet Prep: NONE SEEN

## 2014-06-19 MED ORDER — NYSTATIN 100000 UNIT/GM EX CREA: TOPICAL_CREAM | Freq: Two times a day (BID) | CUTANEOUS | Status: DC

## 2014-06-19 MED ORDER — FLUCONAZOLE 150 MG PO TABS: 150.0000 mg | ORAL_TABLET | Freq: Once | ORAL | Status: DC

## 2014-06-19 NOTE — Telephone Encounter (Signed)
Patient saw Natalie Meyer today and was under impression she should have 2 prescriptions but [facility] only had the Diflucan for her.  When I looked [name] did prescribe Nystatin Cream for her but it was prescribed as Facililty administered as if given in office and did not go to [facility].  I tried to reorder the Rx but I could not find the appropriate choice under Nystatin or Mycostatin in the system. Ultimately, I just called in what [name] had documented. Patient was informed what happened and Rx should be there now.

## 2014-06-19 NOTE — Patient Instructions (Signed)

## 2014-06-19 NOTE — Progress Notes (Signed)
Patient ID: Natalie FarberStacy N Jones, female   DOB: 06/05/1970, 44 y.o.   MRN: [id number] Presents with complaint of intense vaginal itching for 2 weeks, has used over-the-counter Monistat and Vagisil with minimal relief. Denies discharge,  urinary symptoms of pain burning or frequency. Denies abdominal pain or fever. Itching started shortly after exercise started. Exercising 3 times weekly at a gym. Monthly light cycle on Loestrin.  Exam: Appears well. External genitalia erythematous at introitus, very uncomfortable with speculum, not inserted, wet prep done with a Q-tip. Positive for yeast.  Yeast vaginitis  Plan: Diflucan 150 by mouth times one dose with refill. Yeast prevention discussed. Nystatin cream externally as needed. Instructed to call if no relief of symptoms.

## 2014-06-19 NOTE — Addendum Note (Signed)
Addended by: Aura CampsWEBB, JENNIFER L on: 06/19/2014 10:14 AM   Modules accepted: Orders

## 2014-06-19 NOTE — Telephone Encounter (Signed)
Herbert Punthanksaa

## 2014-12-10 ENCOUNTER — Encounter: Payer: Self-pay | Admitting: Women's Health

## 2014-12-10 ENCOUNTER — Ambulatory Visit (INDEPENDENT_AMBULATORY_CARE_PROVIDER_SITE_OTHER): Payer: Managed Care, Other (non HMO) | Admitting: Women's Health

## 2014-12-10 VITALS — BP 116/76

## 2014-12-10 DIAGNOSIS — L739 Follicular disorder, unspecified: Secondary | ICD-10-CM

## 2014-12-10 DIAGNOSIS — L731 Pseudofolliculitis barbae: Secondary | ICD-10-CM | POA: Diagnosis not present

## 2014-12-10 NOTE — Progress Notes (Signed)
Patient ID: Natalie Meyer, female   DOB: Oct 19, 1970, 44 y.o.   MRN: [id number] Presents with questionable vaginal bump. States it had been larger is now smaller and drained small amount of a clear fluid. No HSV history. Denies abdominal pain, fever, discharge or urinary symptoms. Monthly cycle on Loestrin.  Exam: Appears well. External genitalia within normal limits, no visible folliculitis, ulcerations, minimal tenderness on right mid labia, no erythema.  Resolving probable folliculitis  Plan: Reassurance givien regarding normality of exam. Loose clothing, keep area clean and dry, if occurs again apply small amount of Neosporin.

## 2014-12-10 NOTE — Patient Instructions (Signed)
Folliculitis °Folliculitis is redness, soreness, and swelling (inflammation) of the hair follicles. This condition can occur anywhere on the body. People with weakened immune systems, diabetes, or obesity have a greater risk of getting folliculitis. °CAUSES °· Bacterial infection. This is the most common cause. °· Fungal infection. °· Viral infection. °· Contact with certain chemicals, especially oils and tars. °Long-term folliculitis can result from bacteria that live in the nostrils. The bacteria may trigger multiple outbreaks of folliculitis over time. °SYMPTOMS °Folliculitis most commonly occurs on the scalp, thighs, legs, back, buttocks, and areas where hair is shaved frequently. An early sign of folliculitis is a small, white or yellow, pus-filled, itchy lesion (pustule). These lesions appear on a red, inflamed follicle. They are usually less than 0.2 inches (5 mm) wide. When there is an infection of the follicle that goes deeper, it becomes a boil or furuncle. A group of closely packed boils creates a larger lesion (carbuncle). Carbuncles tend to occur in hairy, sweaty areas of the body. °DIAGNOSIS  °Your caregiver can usually tell what is wrong by doing a physical exam. A sample may be taken from one of the lesions and tested in a lab. This can help determine what is causing your folliculitis. °TREATMENT  °Treatment may include: °· Applying warm compresses to the affected areas. °· Taking antibiotic medicines orally or applying them to the skin. °· Draining the lesions if they contain a large amount of pus or fluid. °· Laser hair removal for cases of long-lasting folliculitis. This helps to prevent regrowth of the hair. °HOME CARE INSTRUCTIONS °· Apply warm compresses to the affected areas as directed by your caregiver. °· If antibiotics are prescribed, take them as directed. Finish them even if you start to feel better. °· You may take over-the-counter medicines to relieve itching. °· Do not shave irritated  skin. °· Follow up with your caregiver as directed. °SEEK IMMEDIATE MEDICAL CARE IF:  °· You have increasing redness, swelling, or pain in the affected area. °· You have a fever. °MAKE SURE YOU: °· Understand these instructions. °· Will watch your condition. °· Will get help right away if you are not doing well or get worse. °  °This information is not intended to replace advice given to you by your health care provider. Make sure you discuss any questions you have with your health care provider. °  °Document Released: 05/02/2001 Document Revised: 03/14/2014 Document Reviewed: 05/24/2011 °Elsevier Interactive Patient Education ©2016 [employer] ° °

## 2015-02-16 ENCOUNTER — Other Ambulatory Visit: Payer: Self-pay

## 2015-02-16 DIAGNOSIS — Z3041 Encounter for surveillance of contraceptive pills: Secondary | ICD-10-CM

## 2015-02-16 MED ORDER — NORETHIN ACE-ETH ESTRAD-FE 1-20 MG-MCG PO TABS: 1.0000 | ORAL_TABLET | Freq: Every day | ORAL | Status: DC

## 2015-02-18 ENCOUNTER — Ambulatory Visit (INDEPENDENT_AMBULATORY_CARE_PROVIDER_SITE_OTHER): Payer: Managed Care, Other (non HMO) | Admitting: Women's Health

## 2015-02-18 ENCOUNTER — Encounter: Payer: Self-pay | Admitting: Women's Health

## 2015-02-18 ENCOUNTER — Other Ambulatory Visit (HOSPITAL_COMMUNITY)
Admission: RE | Admit: 2015-02-18 | Discharge: 2015-02-18 | Disposition: A | Discharge status: Home / Self Care | Payer: Managed Care, Other (non HMO) | Source: Ambulatory Visit | Attending: Women's Health | Admitting: Women's Health

## 2015-02-18 VITALS — BP 124/80 | Ht 70.0 in | Wt 160.0 lb

## 2015-02-18 DIAGNOSIS — Z13220 Encounter for screening for lipoid disorders: Secondary | ICD-10-CM | POA: Diagnosis not present

## 2015-02-18 DIAGNOSIS — Z23 Encounter for immunization: Secondary | ICD-10-CM

## 2015-02-18 DIAGNOSIS — Z01419 Encounter for gynecological examination (general) (routine) without abnormal findings: Secondary | ICD-10-CM

## 2015-02-18 DIAGNOSIS — Z833 Family history of diabetes mellitus: Secondary | ICD-10-CM

## 2015-02-18 DIAGNOSIS — Z1151 Encounter for screening for human papillomavirus (HPV): Secondary | ICD-10-CM | POA: Diagnosis present

## 2015-02-18 DIAGNOSIS — Z1329 Encounter for screening for other suspected endocrine disorder: Secondary | ICD-10-CM

## 2015-02-18 DIAGNOSIS — Z3041 Encounter for surveillance of contraceptive pills: Secondary | ICD-10-CM

## 2015-02-18 DIAGNOSIS — B373 Candidiasis of vulva and vagina: Secondary | ICD-10-CM | POA: Diagnosis not present

## 2015-02-18 DIAGNOSIS — B3731 Acute candidiasis of vulva and vagina: Secondary | ICD-10-CM

## 2015-02-18 DIAGNOSIS — N898 Other specified noninflammatory disorders of vagina: Secondary | ICD-10-CM | POA: Diagnosis not present

## 2015-02-18 DIAGNOSIS — Z8489 Family history of other specified conditions: Secondary | ICD-10-CM

## 2015-02-18 LAB — LIPID PANEL
Cholesterol: 139 mg/dL (ref 125–200)
HDL: 55 mg/dL (ref >=46)
LDL CALC: 73 mg/dL (ref <130)
Total CHOL/HDL Ratio: 2.5 Ratio (ref <=5.0)
Triglycerides: 57 mg/dL (ref <150)
VLDL: 11 mg/dL (ref <30)

## 2015-02-18 LAB — CBC WITH DIFFERENTIAL/PLATELET
Basophils Absolute: 0.1 10*3/uL (ref 0.0–0.1)
Basophils Relative: 1 % (ref 0–1)
Eosinophils Absolute: 0.1 10*3/uL (ref 0.0–0.7)
Eosinophils Relative: 1 % (ref 0–5)
HCT: 40.9 % (ref 36.0–46.0)
Hemoglobin: 13.7 g/dL (ref 12.0–15.0)
LYMPHS PCT: 26 % (ref 12–46)
Lymphs Abs: 1.5 10*3/uL (ref 0.7–4.0)
MCH: 30.3 pg (ref 26.0–34.0)
MCHC: 33.5 g/dL (ref 30.0–36.0)
MCV: 90.5 fL (ref 78.0–100.0)
MPV: 10.7 fL (ref 8.6–12.4)
Monocytes Absolute: 0.3 10*3/uL (ref 0.1–1.0)
Monocytes Relative: 6 % (ref 3–12)
Neutro Abs: 3.7 10*3/uL (ref 1.7–7.7)
Neutrophils Relative %: 66 % (ref 43–77)
Platelets: 222 10*3/uL (ref 150–400)
RBC: 4.52 MIL/uL (ref 3.87–5.11)
RDW: 12.8 % (ref 11.5–15.5)
WBC: 5.6 10*3/uL (ref 4.0–10.5)

## 2015-02-18 LAB — WET PREP FOR TRICH, YEAST, CLUE
Clue Cells Wet Prep HPF POC: NONE SEEN
Trich, Wet Prep: NONE SEEN

## 2015-02-18 LAB — TSH: TSH: 1.145 u[IU]/mL (ref 0.350–4.500)

## 2015-02-18 MED ORDER — NORETHIN ACE-ETH ESTRAD-FE 1-20 MG-MCG PO TABS: 1.0000 | ORAL_TABLET | Freq: Every day | ORAL | Status: DC

## 2015-02-18 MED ORDER — FLUCONAZOLE 150 MG PO TABS: 150.0000 mg | ORAL_TABLET | Freq: Once | ORAL | Status: DC

## 2015-02-18 NOTE — Addendum Note (Signed)
Addended by: Kem ParkinsonBARNES, Dorman Calderwood on: 02/18/2015 09:23 AM   Modules accepted: Orders

## 2015-02-18 NOTE — Addendum Note (Signed)
Addended by: Kem ParkinsonBARNES, Jairus Tonne on: 02/18/2015 09:19 AM   Modules accepted: Orders

## 2015-02-18 NOTE — Patient Instructions (Addendum)
Health Maintenance, Female Adopting a healthy lifestyle and getting preventive care can go a long way to promote health and wellness. Talk with your health care provider about what schedule of regular examinations is right for you. This is a good chance for you to check in with your provider about disease prevention and staying healthy. In between checkups, there are plenty of things you can do on your own. Experts have done a lot of research about which lifestyle changes and preventive measures are most likely to keep you healthy. Ask your health care provider for more information. WEIGHT AND DIET  Eat a healthy diet  Be sure to include plenty of vegetables, fruits, low-fat dairy products, and lean protein.  Do not eat a lot of foods high in solid fats, added sugars, or salt.  Get regular exercise. This is one of the most important things you can do for your health.  Most adults should exercise for at least 150 minutes each week. The exercise should increase your heart rate and make you sweat (moderate-intensity exercise).  Most adults should also do strengthening exercises at least twice a week. This is in addition to the moderate-intensity exercise.  Maintain a healthy weight  Body mass index (BMI) is a measurement that can be used to identify possible weight problems. It estimates body fat based on height and weight. Your health care provider can help determine your BMI and help you achieve or maintain a healthy weight.  For females 20 years of age and older:   A BMI below 18.5 is considered underweight.  A BMI of 18.5 to 24.9 is normal.  A BMI of 25 to 29.9 is considered overweight.  A BMI of 30 and above is considered obese.  Watch levels of cholesterol and blood lipids  You should start having your blood tested for lipids and cholesterol at 42 years of age, then have this test every 5 years.  You may need to have your cholesterol levels checked more often if:  Your lipid  or cholesterol levels are high.  You are older than 42 years of age.  You are at high risk for heart disease.  CANCER SCREENING   Lung Cancer  Lung cancer screening is recommended for adults 55-80 years old who are at high risk for lung cancer because of a history of smoking.  A yearly low-dose CT scan of the lungs is recommended for people who:  Currently smoke.  Have quit within the past 15 years.  Have at least a 30-pack-year history of smoking. A pack year is smoking an average of one pack of cigarettes a day for 1 year.  Yearly screening should continue until it has been 15 years since you quit.  Yearly screening should stop if you develop a health problem that would prevent you from having lung cancer treatment.  Breast Cancer  Practice breast self-awareness. This means understanding how your breasts normally appear and feel.  It also means doing regular breast self-exams. Let your health care provider know about any changes, no matter how small.  If you are in your 20s or 30s, you should have a clinical breast exam (CBE) by a health care provider every 1-3 years as part of a regular health exam.  If you are 40 or older, have a CBE every year. Also consider having a breast X-ray (mammogram) every year.  If you have a family history of breast cancer, talk to your health care provider about genetic screening.  If you   are at high risk for breast cancer, talk to your health care provider about having an MRI and a mammogram every year.  Breast cancer gene (BRCA) assessment is recommended for women who have family members with BRCA-related cancers. BRCA-related cancers include:  Breast.  Ovarian.  Tubal.  Peritoneal cancers.  Results of the assessment will determine the need for genetic counseling and BRCA1 and BRCA2 testing. Cervical Cancer Your health care provider may recommend that you be screened regularly for cancer of the pelvic organs (ovaries, uterus, and  vagina). This screening involves a pelvic examination, including checking for microscopic changes to the surface of your cervix (Pap test). You may be encouraged to have this screening done every 3 years, beginning at age 21.  For women ages 30-65, health care providers may recommend pelvic exams and Pap testing every 3 years, or they may recommend the Pap and pelvic exam, combined with testing for human papilloma virus (HPV), every 5 years. Some types of HPV increase your risk of cervical cancer. Testing for HPV may also be done on women of any age with unclear Pap test results.  Other health care providers may not recommend any screening for nonpregnant women who are considered low risk for pelvic cancer and who do not have symptoms. Ask your health care provider if a screening pelvic exam is right for you.  If you have had past treatment for cervical cancer or a condition that could lead to cancer, you need Pap tests and screening for cancer for at least 20 years after your treatment. If Pap tests have been discontinued, your risk factors (such as having a new sexual partner) need to be reassessed to determine if screening should resume. Some women have medical problems that increase the chance of getting cervical cancer. In these cases, your health care provider may recommend more frequent screening and Pap tests. Colorectal Cancer  This type of cancer can be detected and often prevented.  Routine colorectal cancer screening usually begins at 42 years of age and continues through 42 years of age.  Your health care provider may recommend screening at an earlier age if you have risk factors for colon cancer.  Your health care provider may also recommend using home test kits to check for hidden blood in the stool.  A small camera at the end of a tube can be used to examine your colon directly (sigmoidoscopy or colonoscopy). This is done to check for the earliest forms of colorectal  cancer.  Routine screening usually begins at age 50.  Direct examination of the colon should be repeated every 5-10 years through 42 years of age. However, you may need to be screened more often if early forms of precancerous polyps or small growths are found. Skin Cancer  Check your skin from head to toe regularly.  Tell your health care provider about any new moles or changes in moles, especially if there is a change in a mole's shape or color.  Also tell your health care provider if you have a mole that is larger than the size of a pencil eraser.  Always use sunscreen. Apply sunscreen liberally and repeatedly throughout the day.  Protect yourself by wearing long sleeves, pants, a wide-brimmed hat, and sunglasses whenever you are outside. HEART DISEASE, DIABETES, AND HIGH BLOOD PRESSURE   High blood pressure causes heart disease and increases the risk of stroke. High blood pressure is more likely to develop in:  People who have blood pressure in the high end   of the normal range (130-139/85-89 mm Hg).  People who are overweight or obese.  People who are African American.  If you are 38-23 years of age, have your blood pressure checked every 3-5 years. If you are 61 years of age or older, have your blood pressure checked every year. You should have your blood pressure measured twice--once when you are at a hospital or clinic, and once when you are not at a hospital or clinic. Record the average of the two measurements. To check your blood pressure when you are not at a hospital or clinic, you can use:  An automated blood pressure machine at a pharmacy.  A home blood pressure monitor.  If you are between 45 years and 39 years old, ask your health care provider if you should take aspirin to prevent strokes.  Have regular diabetes screenings. This involves taking a blood sample to check your fasting blood sugar level.  If you are at a normal weight and have a low risk for diabetes,  have this test once every three years after 42 years of age.  If you are overweight and have a high risk for diabetes, consider being tested at a younger age or more often. PREVENTING INFECTION  Hepatitis B  If you have a higher risk for hepatitis B, you should be screened for this virus. You are considered at high risk for hepatitis B if:  You were born in a country where hepatitis B is common. Ask your health care provider which countries are considered high risk.  Your parents were born in a high-risk country, and you have not been immunized against hepatitis B (hepatitis B vaccine).  You have HIV or AIDS.  You use needles to inject street drugs.  You live with someone who has hepatitis B.  You have had sex with someone who has hepatitis B.  You get hemodialysis treatment.  You take certain medicines for conditions, including cancer, organ transplantation, and autoimmune conditions. Hepatitis C  Blood testing is recommended for:  Everyone born from [birth year] through 1963.  Anyone with known risk factors for hepatitis C. Sexually transmitted infections (STIs)  You should be screened for sexually transmitted infections (STIs) including gonorrhea and chlamydia if:  You are sexually active and are younger than 42 years of age.  You are older than 42 years of age and your health care provider tells you that you are at risk for this type of infection.  Your sexual activity has changed since you were last screened and you are at an increased risk for chlamydia or gonorrhea. Ask your health care provider if you are at risk.  If you do not have HIV, but are at risk, it may be recommended that you take a prescription medicine daily to prevent HIV infection. This is called pre-exposure prophylaxis (PrEP). You are considered at risk if:  You are sexually active and do not regularly use condoms or know the HIV status of your partner(s).  You take drugs by injection.  You are sexually  active with a partner who has HIV. Talk with your health care provider about whether you are at high risk of being infected with HIV. If you choose to begin PrEP, you should first be tested for HIV. You should then be tested every 3 months for as long as you are taking PrEP.  PREGNANCY   If you are premenopausal and you may become pregnant, ask your health care provider about preconception counseling.  If you may  become pregnant, take 400 to 800 micrograms (mcg) of folic acid every day.  If you want to prevent pregnancy, talk to your health care provider about birth control (contraception). OSTEOPOROSIS AND MENOPAUSE   Osteoporosis is a disease in which the bones lose minerals and strength with aging. This can result in serious bone fractures. Your risk for osteoporosis can be identified using a bone density scan.  If you are 11 years of age or older, or if you are at risk for osteoporosis and fractures, ask your health care provider if you should be screened.  Ask your health care provider whether you should take a calcium or vitamin D supplement to lower your risk for osteoporosis.  Menopause may have certain physical symptoms and risks.  Hormone replacement therapy may reduce some of these symptoms and risks. Talk to your health care provider about whether hormone replacement therapy is right for you.  HOME CARE INSTRUCTIONS   Schedule regular health, dental, and eye exams.  Stay current with your immunizations.   Do not use any tobacco products including cigarettes, chewing tobacco, or electronic cigarettes.  If you are pregnant, do not drink alcohol.  If you are breastfeeding, limit how much and how often you drink alcohol.  Limit alcohol intake to no more than 1 drink per day for nonpregnant women. One drink equals 12 ounces of beer, 5 ounces of wine, or 1 ounces of hard liquor.  Do not use street drugs.  Do not share needles.  Ask your health care provider for help if  you need support or information about quitting drugs.  Tell your health care provider if you often feel depressed.  Tell your health care provider if you have ever been abused or do not feel safe at home.   This information is not intended to replace advice given to you by your health care provider. Make sure you discuss any questions you have with your health care provider.   Document Released: 09/06/2010 Document Revised: 03/14/2014 Document Reviewed: 01/23/2013 Elsevier Interactive Patient Education 2016 [employer] Monilial Vaginitis Vaginitis in a soreness, swelling and redness (inflammation) of the vagina and vulva. Monilial vaginitis is not a sexually transmitted infection. CAUSES  Yeast vaginitis is caused by yeast (candida) that is normally found in your vagina. With a yeast infection, the candida has overgrown in number to a point that upsets the chemical balance. SYMPTOMS   White, thick vaginal discharge.  Swelling, itching, redness and irritation of the vagina and possibly the lips of the vagina (vulva).  Burning or painful urination.  Painful intercourse. DIAGNOSIS  Things that may contribute to monilial vaginitis are:  Postmenopausal and virginal states.  Pregnancy.  Infections.  Being tired, sick or stressed, especially if you had monilial vaginitis in the past.  Diabetes. Good control will help lower the chance.  Birth control pills.  Tight fitting garments.  Using bubble bath, feminine sprays, douches or deodorant tampons.  Taking certain medications that kill germs (antibiotics).  Sporadic recurrence can occur if you become ill. TREATMENT  Your caregiver will give you medication.  There are several kinds of anti monilial vaginal creams and suppositories specific for monilial vaginitis. For recurrent yeast infections, use a suppository or cream in the vagina 2 times a week, or as directed.  Anti-monilial or steroid cream for the itching or  irritation of the vulva may also be used. Get your caregiver's permission.  Painting the vagina with methylene blue solution may help if the monilial cream does  not work.  Eating yogurt may help prevent monilial vaginitis. HOME CARE INSTRUCTIONS   Finish all medication as prescribed.  Do not have sex until treatment is completed or after your caregiver tells you it is okay.  Take warm sitz baths.  Do not douche.  Do not use tampons, especially scented ones.  Wear cotton underwear.  Avoid tight pants and panty hose.  Tell your sexual partner that you have a yeast infection. They should go to their caregiver if they have symptoms such as mild rash or itching.  Your sexual partner should be treated as well if your infection is difficult to eliminate.  Practice safer sex. Use condoms.  Some vaginal medications cause latex condoms to fail. Vaginal medications that harm condoms are:  Cleocin cream.  Butoconazole (Femstat).  Terconazole (Terazol) vaginal suppository.  Miconazole (Monistat) (may be purchased over the counter). SEEK MEDICAL CARE IF:   You have a temperature by mouth above 102 F (38.9 C).  The infection is getting worse after 2 days of treatment.  The infection is not getting better after 3 days of treatment.  You develop blisters in or around your vagina.  You develop vaginal bleeding, and it is not your menstrual period.  You have pain when you urinate.  You develop intestinal problems.  You have pain with sexual intercourse.   This information is not intended to replace advice given to you by your health care provider. Make sure you discuss any questions you have with your health care provider.   Document Released: 12/01/2004 Document Revised: 05/16/2011 Document Reviewed: 08/25/2014 Elsevier Interactive Patient Education 2016 [employer]. Diabetes Mellitus and Food It is important for you to manage your blood sugar (glucose) level. Your  blood glucose level can be greatly affected by what you eat. Eating healthier foods in the appropriate amounts throughout the day at about the same time each day will help you control your blood glucose level. It can also help slow or prevent worsening of your diabetes mellitus. Healthy eating may even help you improve the level of your blood pressure and reach or maintain a healthy weight.  General recommendations for healthful eating and cooking habits include:  Eating meals and snacks regularly. Avoid going long periods of time without eating to lose weight.  Eating a diet that consists mainly of plant-based foods, such as fruits, vegetables, nuts, legumes, and whole grains.  Using low-heat cooking methods, such as baking, instead of high-heat cooking methods, such as deep frying. Work with your dietitian to make sure you understand how to use the Nutrition Facts information on food labels. HOW CAN FOOD AFFECT ME? Carbohydrates Carbohydrates affect your blood glucose level more than any other type of food. Your dietitian will help you determine how many carbohydrates to eat at each meal and teach you how to count carbohydrates. Counting carbohydrates is important to keep your blood glucose at a healthy level, especially if you are using insulin or taking certain medicines for diabetes mellitus. Alcohol Alcohol can cause sudden decreases in blood glucose (hypoglycemia), especially if you use insulin or take certain medicines for diabetes mellitus. Hypoglycemia can be a life-threatening condition. Symptoms of hypoglycemia (sleepiness, dizziness, and disorientation) are similar to symptoms of having too much alcohol.  If your health care provider has given you approval to drink alcohol, do so in moderation and use the following guidelines:  Women should not have more than one drink per day, and men should not have more than two drinks per  day. One drink is equal to:  12 oz of beer.  5 oz of  wine.  1 oz of hard liquor.  Do not drink on an empty stomach.  Keep yourself hydrated. Have water, diet soda, or unsweetened iced tea.  Regular soda, juice, and other mixers might contain a lot of carbohydrates and should be counted. WHAT FOODS ARE NOT RECOMMENDED? As you make food choices, it is important to remember that all foods are not the same. Some foods have fewer nutrients per serving than other foods, even though they might have the same number of calories or carbohydrates. It is difficult to get your body what it needs when you eat foods with fewer nutrients. Examples of foods that you should avoid that are high in calories and carbohydrates but low in nutrients include:  Trans fats (most processed foods list trans fats on the Nutrition Facts label).  Regular soda.  Juice.  Candy.  Sweets, such as cake, pie, doughnuts, and cookies.  Fried foods. WHAT FOODS CAN I EAT? Eat nutrient-rich foods, which will nourish your body and keep you healthy. The food you should eat also will depend on several factors, including:  The calories you need.  The medicines you take.  Your weight.  Your blood glucose level.  Your blood pressure level.  Your cholesterol level. You should eat a variety of foods, including:  Protein.  Lean cuts of meat.  Proteins low in saturated fats, such as fish, egg whites, and beans. Avoid processed meats.  Fruits and vegetables.  Fruits and vegetables that may help control blood glucose levels, such as apples, mangoes, and yams.  Dairy products.  Choose fat-free or low-fat dairy products, such as milk, yogurt, and cheese.  Grains, bread, pasta, and rice.  Choose whole grain products, such as multigrain bread, whole oats, and brown rice. These foods may help control blood pressure.  Fats.  Foods containing healthful fats, such as nuts, avocado, olive oil, canola oil, and fish. DOES EVERYONE WITH DIABETES MELLITUS HAVE THE SAME MEAL  PLAN? Because every person with diabetes mellitus is different, there is not one meal plan that works for everyone. It is very important that you meet with a dietitian who will help you create a meal plan that is just right for you.   This information is not intended to replace advice given to you by your health care provider. Make sure you discuss any questions you have with your health care provider.   Document Released: 11/18/2004 Document Revised: 03/14/2014 Document Reviewed: 01/18/2013 Elsevier Interactive Patient Education [employer].

## 2015-02-18 NOTE — Progress Notes (Signed)
Natalie FarberStacy N Meyer 04/09/1970 [id number]    History:    Presents for annual exam.  Monthly cycle on Loestrin. Questioning BTL. Normal Pap and mammogram history.  Past medical history, past surgical history, family history and social history were all reviewed and documented in the EPIC chart. Owns a preschool. Sons ages 399 and 4513 both doing well. Mother hypertension and type 2 diabetes diet. History of benign hematuria.  ROS:  A ROS was performed and pertinent positives and negatives are included.  Exam:  Filed Vitals:   02/18/15 0839  BP: 124/80    General appearance:  Normal Thyroid:  Symmetrical, normal in size, without palpable masses or nodularity. Respiratory  Auscultation:  Clear without wheezing or rhonchi Cardiovascular  Auscultation:  Regular rate, without rubs, murmurs or gallops  Edema/varicosities:  Not grossly evident Abdominal  Soft,nontender, without masses, guarding or rebound.  Liver/spleen:  No organomegaly noted  Hernia:  None appreciated  Skin  Inspection:  Grossly normal   Breasts: Examined lying and sitting.     Right: Without masses, retractions, discharge or axillary adenopathy.     Left: Without masses, retractions, discharge or axillary adenopathy. Gentitourinary   Inguinal/mons:  Normal without inguinal adenopathy  External genitalia:  Normal  BUS/Urethra/Skene's glands:  Normal  Vagina:  Moderate white discharge, wet prep positive for yeast  Cervix:  Normal  Uterus: normal in size, shape and contour.  Midline and mobile  Adnexa/parametria:     Rt: Without masses or tenderness.   Lt: Without masses or tenderness.  Anus and perineum: Normal  Digital rectal exam: Normal sphincter tone without palpated masses or tenderness  Assessment/Plan:  44 y.o. MBF G2 P2  for annual exam complaining of external vaginal itching occasionally.    Conception management Yeast vaginitis  Plan: Contraception options reviewed of Mirena IUD, BTL, continue pills,  vasectomy. History of stenotic cervical os unable to place IUD, will continue with pills and discuss vasectomy with husband. Loestrin 1/20 prescription, proper use given and reviewed slight risk for blood clots and strokes. SBE's, continue annual screening 3-D mammogram history of dense breasts. Regular exercise, calcium rich diet, vitamin D 1000 daily encouraged. CBC, hemoglobin A1c, TSH, lipid panel, vitamin D, UA, Pap with HR HPV typing. New screening guidelines reviewed. Diflucan 150 by mouth 1 dose prescription, proper use given and reviewed.   Harrington ChallengerYOUNG,Rosselyn Martha J W [facility] Beach Va Medical CenterWHNP, 9:13 AM 02/18/2015

## 2015-02-19 LAB — URINALYSIS W MICROSCOPIC + REFLEX CULTURE
BACTERIA UA: NONE SEEN [HPF]
Bilirubin Urine: NEGATIVE
Casts: NONE SEEN [LPF]
Crystals: NONE SEEN [HPF]
Glucose, UA: NEGATIVE
Ketones, ur: NEGATIVE
Leukocytes, UA: NEGATIVE
Nitrite: NEGATIVE
PROTEIN: NEGATIVE
Specific Gravity, Urine: 1.025 (ref 1.001–1.035)
Yeast: NONE SEEN [HPF]
pH: 5.5 (ref 5.0–8.0)

## 2015-02-19 LAB — VITAMIN D 25 HYDROXY (VIT D DEFICIENCY, FRACTURES): Vit D, 25-Hydroxy: 43 ng/mL (ref 30–100)

## 2015-02-19 LAB — HEMOGLOBIN A1C
HEMOGLOBIN A1C: 5.7 % — AB (ref <5.7)
MEAN PLASMA GLUCOSE: 117 mg/dL — AB (ref <117)

## 2015-02-19 LAB — CYTOLOGY - PAP

## 2015-02-20 LAB — URINE CULTURE
Colony Count: NO GROWTH
Organism ID, Bacteria: NO GROWTH

## 2015-03-10 ENCOUNTER — Other Ambulatory Visit: Payer: Self-pay

## 2015-03-10 DIAGNOSIS — Z3041 Encounter for surveillance of contraceptive pills: Secondary | ICD-10-CM

## 2015-03-10 MED ORDER — NORETHIN ACE-ETH ESTRAD-FE 1-20 MG-MCG PO TABS
1.0000 | ORAL_TABLET | Freq: Every day | ORAL | Status: DC
Start: 2015-03-10 — End: 2016-02-19

## 2015-08-12 ENCOUNTER — Ambulatory Visit (INDEPENDENT_AMBULATORY_CARE_PROVIDER_SITE_OTHER): Payer: Managed Care, Other (non HMO) | Admitting: Women's Health

## 2015-08-12 ENCOUNTER — Encounter: Payer: Self-pay | Admitting: Women's Health

## 2015-08-12 VITALS — BP 118/80 | Ht 70.0 in | Wt 160.0 lb

## 2015-08-12 DIAGNOSIS — K640 First degree hemorrhoids: Secondary | ICD-10-CM | POA: Diagnosis not present

## 2015-08-12 DIAGNOSIS — R350 Frequency of micturition: Secondary | ICD-10-CM | POA: Diagnosis not present

## 2015-08-12 DIAGNOSIS — B373 Candidiasis of vulva and vagina: Secondary | ICD-10-CM

## 2015-08-12 DIAGNOSIS — B3731 Acute candidiasis of vulva and vagina: Secondary | ICD-10-CM

## 2015-08-12 LAB — URINALYSIS W MICROSCOPIC + REFLEX CULTURE
BILIRUBIN URINE: NEGATIVE
Casts: NONE SEEN [LPF]
Crystals: NONE SEEN [HPF]
Glucose, UA: NEGATIVE
KETONES UR: NEGATIVE
NITRITE: NEGATIVE
Protein, ur: NEGATIVE
SPECIFIC GRAVITY, URINE: 1.02 (ref 1.001–1.035)
YEAST: NONE SEEN [HPF]
pH: 7.5 (ref 5.0–8.0)

## 2015-08-12 LAB — WET PREP FOR TRICH, YEAST, CLUE
Clue Cells Wet Prep HPF POC: NONE SEEN
Trich, Wet Prep: NONE SEEN
Yeast Wet Prep HPF POC: NONE SEEN

## 2015-08-12 MED ORDER — HYDROCORTISONE ACE-PRAMOXINE 2.5-1 % RE CREA: 1.0000 "application " | TOPICAL_CREAM | Freq: Three times a day (TID) | RECTAL | Status: DC

## 2015-08-12 MED ORDER — FLUCONAZOLE 150 MG PO TABS: 150.0000 mg | ORAL_TABLET | Freq: Once | ORAL | Status: DC

## 2015-08-12 NOTE — Patient Instructions (Signed)

## 2015-08-12 NOTE — Progress Notes (Signed)
Patient ID: Mickey FarberStacy N Meyer, female   DOB: 10/20/1970, 45 y.o.   MRN: [id number] Presents with complaint of increased vaginal irritation with itching both vaginally and in the rectal area for the past week. States vaginal itching/irritation awakens her at night.  Mild urinary frequency without pain or burning. Denies discharge with odor,  abdominal pain or fever.  Exam: Appears well. External genitalia mild erythema at introitus, speculum exam scant white discharge with erythema, wet prep negative. Bimanual no CMT or adnexal tenderness. Slight irritation at the rectum small nonthrombosed hemorrhoid noted.  Clinical yeast Small hemorrhoids  Plan: Options reviewed Diflucan 150 by mouth 1 dose repeat in 3 days if needed. Yeast prevention discussed instructed to call if no relief of symptoms. Analpram rectal cream prescription, proper use given instructed to call if no relief. Constipation prevention discussed.

## 2015-08-13 LAB — URINE CULTURE

## 2016-02-19 ENCOUNTER — Ambulatory Visit (INDEPENDENT_AMBULATORY_CARE_PROVIDER_SITE_OTHER): Payer: Managed Care, Other (non HMO) | Admitting: Women's Health

## 2016-02-19 ENCOUNTER — Encounter: Payer: Self-pay | Admitting: Women's Health

## 2016-02-19 VITALS — BP 110/80 | Ht 70.0 in | Wt 169.0 lb

## 2016-02-19 DIAGNOSIS — Z13220 Encounter for screening for lipoid disorders: Secondary | ICD-10-CM | POA: Diagnosis not present

## 2016-02-19 DIAGNOSIS — Z01419 Encounter for gynecological examination (general) (routine) without abnormal findings: Secondary | ICD-10-CM

## 2016-02-19 DIAGNOSIS — Z3041 Encounter for surveillance of contraceptive pills: Secondary | ICD-10-CM

## 2016-02-19 LAB — CBC WITH DIFFERENTIAL/PLATELET
Basophils Absolute: 53 cells/uL (ref 0–200)
Basophils Relative: 1 %
EOS PCT: 1 %
Eosinophils Absolute: 53 cells/uL (ref 15–500)
HEMATOCRIT: 41.1 % (ref 35.0–45.0)
Hemoglobin: 13.6 g/dL (ref 11.7–15.5)
LYMPHS PCT: 31 %
Lymphs Abs: 1643 cells/uL (ref 850–3900)
MCH: 29.8 pg (ref 27.0–33.0)
MCHC: 33.1 g/dL (ref 32.0–36.0)
MCV: 89.9 fL (ref 80.0–100.0)
MPV: 10.6 fL (ref 7.5–12.5)
Monocytes Absolute: 371 cells/uL (ref 200–950)
Monocytes Relative: 7 %
NEUTROS PCT: 60 %
Neutro Abs: 3180 cells/uL (ref 1500–7800)
Platelets: 255 10*3/uL (ref 140–400)
RBC: 4.57 MIL/uL (ref 3.80–5.10)
RDW: 13.2 % (ref 11.0–15.0)
WBC: 5.3 10*3/uL (ref 3.8–10.8)

## 2016-02-19 LAB — COMPREHENSIVE METABOLIC PANEL
ALT: 6 U/L (ref 6–29)
AST: 14 U/L (ref 10–30)
Albumin: 3.9 g/dL (ref 3.6–5.1)
Alkaline Phosphatase: 61 U/L (ref 33–115)
BILIRUBIN TOTAL: 0.6 mg/dL (ref 0.2–1.2)
BUN: 12 mg/dL (ref 7–25)
CALCIUM: 8.7 mg/dL (ref 8.6–10.2)
CHLORIDE: 107 mmol/L (ref 98–110)
CO2: 23 mmol/L (ref 20–31)
Creat: 0.98 mg/dL (ref 0.50–1.10)
GLUCOSE: 94 mg/dL (ref 65–99)
Potassium: 3.9 mmol/L (ref 3.5–5.3)
SODIUM: 139 mmol/L (ref 135–146)
Total Protein: 6.6 g/dL (ref 6.1–8.1)

## 2016-02-19 LAB — LIPID PANEL
Cholesterol: 150 mg/dL (ref <200)
HDL: 59 mg/dL (ref >50)
LDL CALC: 80 mg/dL (ref <100)
Total CHOL/HDL Ratio: 2.5 Ratio (ref <5.0)
Triglycerides: 53 mg/dL (ref <150)
VLDL: 11 mg/dL (ref <30)

## 2016-02-19 LAB — URINALYSIS W MICROSCOPIC + REFLEX CULTURE
BACTERIA UA: NONE SEEN [HPF]
BILIRUBIN URINE: NEGATIVE
CRYSTALS: NONE SEEN [HPF]
Casts: NONE SEEN [LPF]
Glucose, UA: NEGATIVE
KETONES UR: NEGATIVE
Nitrite: NEGATIVE
PROTEIN: NEGATIVE
Specific Gravity, Urine: 1.027 (ref 1.001–1.035)
Yeast: NONE SEEN [HPF]
pH: 5 (ref 5.0–8.0)

## 2016-02-19 MED ORDER — LEVONORGESTREL-ETHINYL ESTRAD 0.1-20 MG-MCG PO TABS: 1.0000 | ORAL_TABLET | Freq: Every day | ORAL | 4 refills | Status: DC

## 2016-02-19 NOTE — Progress Notes (Signed)
Natalie Meyer 03/07/1970 [id number]    History:    Presents for annual exam.  Monthly cycle on Loestrin but has headaches week of cycle relief by Motrin. History of benign hematuria. Normal Pap and mammogram history.  Past medical history, past surgical history, family history and social history were all reviewed and documented in the EPIC chart. Is a preschool. 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 Mother hypertension and diabetes.  ROS:  A ROS was performed and pertinent positives and negatives are included.  Exam:  Vitals:   02/19/16 0906  BP: 110/80  Weight: 169 lb (76.7 kg)  Height: 5\' 10"  (1.778 m)   Body mass index is 24.25 kg/m.   General appearance:  Normal Thyroid:  Symmetrical, normal in size, without palpable masses or nodularity. Respiratory  Auscultation:  Clear without wheezing or rhonchi Cardiovascular  Auscultation:  Regular rate, without rubs, murmurs or gallops  Edema/varicosities:  Not grossly evident Abdominal  Soft,nontender, without masses, guarding or rebound.  Liver/spleen:  No organomegaly noted  Hernia:  None appreciated  Skin  Inspection:  Grossly normal   Breasts: Examined lying and sitting.     Right: Without masses, retractions, discharge or axillary adenopathy.     Left: Without masses, retractions, discharge or axillary adenopathy. Gentitourinary   Inguinal/mons:  Normal without inguinal adenopathy  External genitalia:  Normal  BUS/Urethra/Skene's glands:  Normal  Vagina:  Normal  Cervix:  Normal  Uterus:  normal in size, shape and contour.  Midline and mobile  Adnexa/parametria:     Rt: Without masses or tenderness.   Lt: Without masses or tenderness.  Anus and perineum: Normal  Digital rectal exam: Normal sphincter tone without palpated masses or tenderness  Assessment/Plan:  45 y.o. MBF G2 P2 for annual exam with Headaches week of cycle.  Monthly light cycle on Loestrin with headaches week of cycle History of  benign hematuria  Plan: Options reviewed will try Alesse prescription, proper use given and reviewed slight risk for blood clots and strokes. Will take continuously stop for 4 days if bleeding starts. Instructed to call if continued headaches. SBE's, continue annual screening mammogram, calcium rich diet, vitamin D 1000 daily encouraged. CBC, CMP, lipid panel, vitamin D, UA, Pap normal with negative HR HPV 2016, new screening guidelines reviewed.    Harrington Challengerancy J Mairany Bruno [facility], 9:39 AM 02/19/2016

## 2016-02-19 NOTE — Patient Instructions (Signed)

## 2016-02-20 LAB — VITAMIN D 25 HYDROXY (VIT D DEFICIENCY, FRACTURES): VIT D 25 HYDROXY: 29 ng/mL — AB (ref 30–100)

## 2016-02-20 LAB — URINE CULTURE

## 2016-04-19 ENCOUNTER — Ambulatory Visit (INDEPENDENT_AMBULATORY_CARE_PROVIDER_SITE_OTHER): Payer: Managed Care, Other (non HMO) | Admitting: Women's Health

## 2016-04-19 ENCOUNTER — Encounter: Payer: Self-pay | Admitting: Women's Health

## 2016-04-19 VITALS — BP 110/70

## 2016-04-19 DIAGNOSIS — B9689 Other specified bacterial agents as the cause of diseases classified elsewhere: Secondary | ICD-10-CM | POA: Diagnosis not present

## 2016-04-19 DIAGNOSIS — B373 Candidiasis of vulva and vagina: Secondary | ICD-10-CM

## 2016-04-19 DIAGNOSIS — N760 Acute vaginitis: Secondary | ICD-10-CM

## 2016-04-19 DIAGNOSIS — B3731 Acute candidiasis of vulva and vagina: Secondary | ICD-10-CM

## 2016-04-19 LAB — WET PREP FOR TRICH, YEAST, CLUE
TRICH WET PREP: NONE SEEN
YEAST WET PREP: NONE SEEN

## 2016-04-19 MED ORDER — CLOBETASOL PROPIONATE 0.05 % EX CREA: TOPICAL_CREAM | Freq: Two times a day (BID) | CUTANEOUS | Status: DC

## 2016-04-19 MED ORDER — METRONIDAZOLE 0.75 % VA GEL: VAGINAL | 0 refills | Status: DC

## 2016-04-19 MED ORDER — FLUCONAZOLE 150 MG PO TABS: 150.0000 mg | ORAL_TABLET | Freq: Once | ORAL | 1 refills | Status: AC

## 2016-04-19 MED ORDER — ESTRADIOL 0.025 MG/24HR TD PTTW: 1.0000 | MEDICATED_PATCH | TRANSDERMAL | 12 refills | Status: DC

## 2016-04-19 MED ORDER — CLOBETASOL PROPIONATE 0.05 % EX CREA: 1.0000 "application " | TOPICAL_CREAM | Freq: Two times a day (BID) | CUTANEOUS | 1 refills | Status: DC

## 2016-04-19 NOTE — Progress Notes (Signed)
Presents with complaint of intense external itching from the right side of mons pubis  extending down right labia for the past 2 months. No itching on the left side. Denies vaginal discharge with itching or odor urinary symptoms, abdominal pain or urinary symptoms. No relief with over-the-counter cortisone. Same partner. Monthly cycle on OCs, continues with headaches week of cycle.   Exam: Appears well. External genitalia right upper mons minimal erythema, white plaque extending down right labia, wet prep done with a Q-tip, patient declined speculum exam,  wet prep positive for clues, TNTC bacteria.  Intense external itching with plaque formation questionable eczema Bacteria vaginosis Menstrual headaches  Plan: Apply small amount of Temovate cream externally twice daily, Diflucan 150 by mouth 1 dose, MetroGel vaginal cream 1 applicator at bedtime 5, alcohol precautions reviewed. Instructed to call if continued symptoms . Reviewed importance of loose clothing, changing clothes after exercise. Continue Loestrin, will add Vivelle-Dot 0.025 patch weekly of cycle. Will call if no relief of menstrual headaches.

## 2016-04-19 NOTE — Patient Instructions (Signed)

## 2016-05-09 ENCOUNTER — Telehealth: Payer: Self-pay | Admitting: *Deleted

## 2016-05-09 NOTE — Telephone Encounter (Signed)
She gets migraines wk of cycle, we were going to try 1 patch at beginning of cycle to see if prevention. So for now just try patch day prior to cycle if possible. (some migraines are menstrual in nature from drop in estrogen)

## 2016-05-09 NOTE — Telephone Encounter (Signed)
Pt called with question  was prescribed 04/19/16 vivelle-dot 0.025 mg to place week of cycle. This patch is a twice weekly patch will she place one patch on day 1 of cycle then another patch at the end of cycle? And continue this every month? Please advise

## 2016-05-09 NOTE — Telephone Encounter (Signed)
Pt informed with the below note. 

## 2016-07-20 ENCOUNTER — Encounter: Payer: Self-pay | Admitting: Gynecology

## 2016-11-24 ENCOUNTER — Telehealth: Payer: Self-pay | Admitting: Surgery

## 2016-11-24 NOTE — Telephone Encounter (Signed)
This is a new pt appt, sched it 01/09/17; lab at 12:30 and vwb AT 1:45. Lm on cell# for more details are there are none in epic.

## 2016-11-24 NOTE — Telephone Encounter (Signed)
-----   Message from Sharee Pimple, RN sent at 11/24/2016  8:18 AM EDT ----- Regarding: appt per [facility]WB    ----- Message ----- From: Nada Libman, MD Sent: 11/23/2016   9:21 PM To: [facility] Charge Pool  Dr. Shon Baton asked me to see this patient for varicose veins.  Can we get her in within the next 2-3 weeks with a venous reflux study.  Thanks

## 2017-01-03 ENCOUNTER — Other Ambulatory Visit: Payer: Self-pay

## 2017-01-03 DIAGNOSIS — I83893 Varicose veins of bilateral lower extremities with other complications: Secondary | ICD-10-CM

## 2017-01-09 ENCOUNTER — Encounter: Payer: Self-pay | Admitting: Surgery

## 2017-01-09 ENCOUNTER — Ambulatory Visit (HOSPITAL_COMMUNITY)
Admission: RE | Admit: 2017-01-09 | Discharge: 2017-01-09 | Disposition: A | Discharge status: Home / Self Care | Payer: Managed Care, Other (non HMO) | Source: Ambulatory Visit | Attending: Surgery | Admitting: Surgery

## 2017-01-09 ENCOUNTER — Ambulatory Visit: Payer: Managed Care, Other (non HMO) | Admitting: Surgery

## 2017-01-09 VITALS — BP 105/73 | HR 83 | Temp 99.1°F | Resp 16 | Ht 70.0 in | Wt 165.0 lb

## 2017-01-09 DIAGNOSIS — I872 Venous insufficiency (chronic) (peripheral): Secondary | ICD-10-CM

## 2017-01-09 DIAGNOSIS — I83893 Varicose veins of bilateral lower extremities with other complications: Secondary | ICD-10-CM | POA: Diagnosis not present

## 2017-01-09 NOTE — Progress Notes (Signed)
Vascular and Vein Specialist of [facility]  Patient name: Natalie Meyer MRN: [id number] DOB: October 01, 1970 Sex: female   REQUESTING PROVIDER:    Dr. Shon Baton   REASON FOR CONSULT:    Varicose veins  HISTORY OF PRESENT ILLNESS:   Natalie Meyer is a 46 y.o. female, who is referred today for evaluation of varicose veins.  The patient states that she has had surface veins for many years but appear to be getting worse.  She does not have a significant pain or swelling.  PAST MEDICAL HISTORY    Past Medical History:  Diagnosis Date  . Hemorrhoid      FAMILY HISTORY   Family History  Problem Relation Age of Onset  . Hypertension Mother   . Cancer Paternal Aunt        COLON CANCER  . Diabetes Maternal Grandmother   . Cancer Paternal Grandmother        STOMACH CANCER  . Heart disease Paternal Grandfather     SOCIAL HISTORY:   Social History   Socioeconomic History  . Marital status: Married    Spouse name: Not on file  . Number of children: Not on file  . Years of education: Not on file  . Highest education level: Not on file  Social Needs  . Financial resource strain: Not on file  . Food insecurity - worry: Not on file  . Food insecurity - inability: Not on file  . Transportation needs - medical: Not on file  . Transportation needs - non-medical: Not on file  Occupational History  . Not on file  Tobacco Use  . Smoking status: Never Smoker  . Smokeless tobacco: Never Used  Substance and Sexual Activity  . Alcohol use: No    Alcohol/week: 0.0 oz  . Drug use: No  . Sexual activity: Yes    Birth control/protection: Pill  Other Topics Concern  . Not on file  Social History Narrative  . Not on file    ALLERGIES:    No Known Allergies  CURRENT MEDICATIONS:    Current Outpatient Medications  Medication Sig Dispense Refill  . clobetasol cream (TEMOVATE) 0.05 % Apply 1 application topically 2 (two) times daily. 60 g 1  .  estradiol (VIVELLE-DOT) 0.025 MG/24HR Place 1 patch onto the skin 2 (two) times a week. 8 patch 12  . hydrocortisone-pramoxine (ANALPRAM HC) 2.5-1 % rectal cream Place 1 application rectally 3 (three) times daily. 30 g 1  . levonorgestrel-ethinyl estradiol (AVIANE,ALESSE,LESSINA) 0.1-20 MG-MCG tablet Take 1 tablet by mouth daily. Take pills continuously skip placebo 4 Package 4  . metroNIDAZOLE (METROGEL VAGINAL) 0.75 % vaginal gel 1 applicator per vagina at HS x 5 70 g 0   Current Facility-Administered Medications  Medication Dose Route Frequency Provider Last Rate Last Dose  . nystatin cream (MYCOSTATIN)   Topical BID Harrington Challenger, NP        REVIEW OF SYSTEMS:   [X]  denotes positive finding, [ ]  denotes negative finding Cardiac  Comments:  Chest pain or chest pressure:    Shortness of breath upon exertion:    Short of breath when lying flat:    Irregular heart rhythm:        Vascular    Pain in calf, thigh, or hip brought on by ambulation:    Pain in feet at night that wakes you up from your sleep:     Blood clot in your veins:    Leg swelling:  Pulmonary    Oxygen at home:    Productive cough:     Wheezing:         Neurologic    Sudden weakness in arms or legs:     Sudden numbness in arms or legs:     Sudden onset of difficulty speaking or slurred speech:    Temporary loss of vision in one eye:     Problems with dizziness:         Gastrointestinal    Blood in stool:      Vomited blood:         Genitourinary    Burning when urinating:     Blood in urine:        Psychiatric    Major depression:         Hematologic    Bleeding problems:    Problems with blood clotting too easily:        Skin    Rashes or ulcers:        Constitutional    Fever or chills:     PHYSICAL EXAM:   Vitals:   01/09/17 1345  BP: 105/73  Pulse: 83  Resp: 16  Temp: 99.1 F (37.3 C)  TempSrc: Other (Comment)  SpO2: 100%  Weight: 165 lb (74.8 kg)  Height: 5\' 10"   (1.778 m)    GENERAL: The patient is a well-nourished female, in no acute distress. The vital signs are documented above. CARDIAC: There is a regular rate and rhythm.  VASCULAR: Multiple spider veins in the upper thigh particularly laterally on the left leg. PULMONARY: Nonlabored respirations MUSCULOSKELETAL: There are no major deformities or cyanosis. NEUROLOGIC: No focal weakness or paresthesias are detected. SKIN: There are no ulcers or rashes noted. PSYCHIATRIC: The patient has a normal affect.  STUDIES:   I have ordered and reviewed her venous reflux study.  She has no evidence of deep vein thrombosis.  There is venous reflux in the bilateral common femoral vein as well as in the right great saphenous vein.  ASSESSMENT and PLAN   Spider veins: Based on the patient's venous reflux study, I do not think at this time she requires treatment of her saphenous vein reflux.  I would recommend treatment of her spider veins at her convenience.  I will get her in contact with Clementeen HoofLiz Wert who performs laser treatment and sclerotherapy.   Durene CalWells Brabham, MD [facility] Tel [phone] Pager [phone]

## 2017-02-21 ENCOUNTER — Encounter: Payer: Self-pay | Admitting: Women's Health

## 2017-02-21 ENCOUNTER — Ambulatory Visit (INDEPENDENT_AMBULATORY_CARE_PROVIDER_SITE_OTHER): Payer: Managed Care, Other (non HMO) | Admitting: Women's Health

## 2017-02-21 VITALS — BP 132/80 | Ht 70.0 in | Wt 168.0 lb

## 2017-02-21 DIAGNOSIS — Z3041 Encounter for surveillance of contraceptive pills: Secondary | ICD-10-CM | POA: Diagnosis not present

## 2017-02-21 DIAGNOSIS — B373 Candidiasis of vulva and vagina: Secondary | ICD-10-CM | POA: Diagnosis not present

## 2017-02-21 DIAGNOSIS — Z01419 Encounter for gynecological examination (general) (routine) without abnormal findings: Secondary | ICD-10-CM

## 2017-02-21 DIAGNOSIS — B3731 Acute candidiasis of vulva and vagina: Secondary | ICD-10-CM

## 2017-02-21 LAB — WET PREP FOR TRICH, YEAST, CLUE

## 2017-02-21 MED ORDER — LEVONORGESTREL-ETHINYL ESTRAD 0.1-20 MG-MCG PO TABS
1.0000 | ORAL_TABLET | Freq: Every day | ORAL | 4 refills | Status: DC
Start: 2017-02-21 — End: 2018-02-26

## 2017-02-21 MED ORDER — FLUCONAZOLE 150 MG PO TABS: 150.0000 mg | ORAL_TABLET | Freq: Once | ORAL | 1 refills | Status: AC

## 2017-02-21 NOTE — Patient Instructions (Signed)

## 2017-02-21 NOTE — Progress Notes (Signed)
Natalie FarberStacy N Jones 12/10/1970 [id number]    History:    Presents for annual exam.  Light monthly cycle on Alesse without complaint. Normal Pap and mammogram history. History of benign hematuria with negative GU  workup. Husband scheduled for a vasectomy.  Past medical history, past surgical history, family history and social history were all reviewed and documented in the EPIC chart. Owns a preschool. 2 sons ages 6011 and 2515 both doing well. Mother hypertension and diabetes.  ROS:  A ROS was performed and pertinent positives and negatives are included.  Exam:  Vitals:   02/21/17 0934  BP: 132/80  Weight: 168 lb (76.2 kg)  Height: 5\' 10"  (1.778 m)   Body mass index is 24.11 kg/m.   General appearance:  Normal Thyroid:  Symmetrical, normal in size, without palpable masses or nodularity. Respiratory  Auscultation:  Clear without wheezing or rhonchi Cardiovascular  Auscultation:  Regular rate, without rubs, murmurs or gallops  Edema/varicosities:  Not grossly evident Abdominal  Soft,nontender, without masses, guarding or rebound.  Liver/spleen:  No organomegaly noted  Hernia:  None appreciated  Skin  Inspection:  Grossly normal   Breasts: Examined lying and sitting.     Right: Without masses, retractions, discharge or axillary adenopathy.     Left: Without masses, retractions, discharge or axillary adenopathy. Gentitourinary   Inguinal/mons:  Normal without inguinal adenopathy  External genitalia:  Normal  BUS/Urethra/Skene's glands:  Normal  Vagina:  Normal wet prep negative  Cervix:  Normal  Uterus:   normal in size, shape and contour.  Midline and mobile  Adnexa/parametria:     Rt: Without masses or tenderness.   Lt: Without masses or tenderness.  Anus and perineum: Normal  Digital rectal exam: Normal sphincter tone without palpated masses or tenderness  Assessment/Plan:  46 y.o. MBF G2 P2 for annual exam with complaint of vaginal itching.  Light monthly cycle on  Alesse History of benign hematuria Labs-primary care  Plan: Alesse prescription, proper use, slight risk for blood clots and strokes reviewed. Aware not to stop OCs until negative sperm count after husband's vasectomy. . Normality of wet prep and exam reassured does not appear to be yeast. SBE's, continue annual screening mammogram, exercise, calcium rich diet, vitamin D 1000 daily encouraged. Pap 2016, new screening guidelines reviewed.  Harrington Challengerancy J Balinda Heacock [facility], 1:29 PM 02/21/2017

## 2017-02-23 LAB — URINE CULTURE
MICRO NUMBER: 81427317
SPECIMEN QUALITY: ADEQUATE

## 2017-02-23 LAB — URINALYSIS W MICROSCOPIC + REFLEX CULTURE
BILIRUBIN URINE: NEGATIVE
Bacteria, UA: NONE SEEN /HPF
GLUCOSE, UA: NEGATIVE
Hyaline Cast: NONE SEEN /LPF
NITRITES URINE, INITIAL: NEGATIVE
PROTEIN: NEGATIVE
SPECIFIC GRAVITY, URINE: 1.022 (ref 1.001–1.03)
WBC, UA: NONE SEEN /HPF (ref 0–5)
pH: <=5 (ref 5.0–8.0)

## 2017-02-23 LAB — CULTURE INDICATED

## 2017-12-06 DIAGNOSIS — Z833 Family history of diabetes mellitus: Secondary | ICD-10-CM | POA: Insufficient documentation

## 2018-02-26 ENCOUNTER — Other Ambulatory Visit: Payer: Self-pay | Admitting: Women's Health

## 2018-02-26 ENCOUNTER — Encounter: Payer: Managed Care, Other (non HMO) | Admitting: Women's Health

## 2018-02-26 DIAGNOSIS — Z3041 Encounter for surveillance of contraceptive pills: Secondary | ICD-10-CM

## 2018-03-05 ENCOUNTER — Encounter: Payer: Managed Care, Other (non HMO) | Admitting: Women's Health

## 2018-04-04 ENCOUNTER — Encounter: Payer: Self-pay | Admitting: Women's Health

## 2018-04-04 ENCOUNTER — Ambulatory Visit (INDEPENDENT_AMBULATORY_CARE_PROVIDER_SITE_OTHER): Payer: Managed Care, Other (non HMO) | Admitting: Women's Health

## 2018-04-04 VITALS — BP 120/90 | Ht 70.5 in | Wt 164.6 lb

## 2018-04-04 DIAGNOSIS — Z01419 Encounter for gynecological examination (general) (routine) without abnormal findings: Secondary | ICD-10-CM

## 2018-04-04 DIAGNOSIS — Z833 Family history of diabetes mellitus: Secondary | ICD-10-CM | POA: Diagnosis not present

## 2018-04-04 DIAGNOSIS — Z3041 Encounter for surveillance of contraceptive pills: Secondary | ICD-10-CM | POA: Diagnosis not present

## 2018-04-04 MED ORDER — LEVONORGESTREL-ETHINYL ESTRAD 0.1-20 MG-MCG PO TABS: ORAL_TABLET | ORAL | 4 refills | Status: DC

## 2018-04-04 NOTE — Patient Instructions (Signed)

## 2018-04-04 NOTE — Progress Notes (Signed)
Natalie Meyer [birth year] [id number]    History:    Presents for annual exam.  Monthly cycle on Alesse without complaint.  Normal Pap and mammogram history.  History of benign hematuria with negative work-up.   Past medical history, past surgical history, family history and social history were all reviewed and documented in the EPIC chart.  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  2 sons ages 32 and 70 both doing well.  Mother hypertension and diabetes.  ROS:  A ROS was performed and pertinent positives and negatives are included.  Exam:  Vitals:   04/04/18 1612  BP: 120/90  Weight: 164 lb 9.6 oz (74.7 kg)  Height: 5' 10.5" (1.791 m)   Body mass index is 23.28 kg/m.   General appearance:  Normal Thyroid:  Symmetrical, normal in size, without palpable masses or nodularity. Respiratory  Auscultation:  Clear without wheezing or rhonchi Cardiovascular  Auscultation:  Regular rate, without rubs, murmurs or gallops  Edema/varicosities:  Not grossly evident Abdominal  Soft,nontender, without masses, guarding or rebound.  Liver/spleen:  No organomegaly noted  Hernia:  None appreciated  Skin  Inspection:  Grossly normal   Breasts: Examined lying and sitting.     Right: Without masses, retractions, discharge or axillary adenopathy.     Left: Without masses, retractions, discharge or axillary adenopathy. Gentitourinary   Inguinal/mons:  Normal without inguinal adenopathy  External genitalia:  Normal  BUS/Urethra/Skene's glands:  Normal  Vagina:  Normal  Cervix:  Normal  Uterus:  normal in size, shape and contour.  Midline and mobile  Adnexa/parametria:     Rt: Without masses or tenderness.   Lt: Without masses or tenderness.  Anus and perineum: Normal  Digital rectal exam: Normal sphincter tone without palpated masses or tenderness  Assessment/Plan:  48 y.o. MBF G2 P2 for annual exam with no complaints.  Monthly cycle on Alesse Situational anxiety/physician  offices  Plan: Reviewed blood pressure slightly elevated, will check away from office if it continues greater than 120/80 instructed to call.  SBEs, continue annual screening mammogram, calcium rich foods, vitamin D 1000 daily encouraged.  Alesse prescription, proper use, slight risk for blood clots and strokes reviewed.  CBC, CMP, hemoglobin A1c, Pap with HR HPV typing.  New screening guidelines reviewed.   Harrington Challenger [facility], 6:17 PM 04/04/2018

## 2018-04-05 LAB — CBC WITH DIFFERENTIAL/PLATELET
Absolute Monocytes: 378 cells/uL (ref 200–950)
BASOS ABS: 82 {cells}/uL (ref 0–200)
Basophils Relative: 1.3 %
Eosinophils Absolute: 32 cells/uL (ref 15–500)
Eosinophils Relative: 0.5 %
HCT: 37.7 % (ref 35.0–45.0)
Hemoglobin: 12.9 g/dL (ref 11.7–15.5)
Lymphs Abs: 2060 cells/uL (ref 850–3900)
MCH: 29.8 pg (ref 27.0–33.0)
MCHC: 34.2 g/dL (ref 32.0–36.0)
MCV: 87.1 fL (ref 80.0–100.0)
MPV: 10.8 fL (ref 7.5–12.5)
Monocytes Relative: 6 %
NEUTROS PCT: 59.5 %
Neutro Abs: 3749 cells/uL (ref 1500–7800)
Platelets: 262 10*3/uL (ref 140–400)
RBC: 4.33 10*6/uL (ref 3.80–5.10)
RDW: 11.7 % (ref 11.0–15.0)
Total Lymphocyte: 32.7 %
WBC: 6.3 10*3/uL (ref 3.8–10.8)

## 2018-04-05 LAB — COMPREHENSIVE METABOLIC PANEL
AG Ratio: 1.5 (calc) (ref 1.0–2.5)
ALT: 9 U/L (ref 6–29)
AST: 14 U/L (ref 10–35)
Albumin: 4 g/dL (ref 3.6–5.1)
Alkaline phosphatase (APISO): 86 U/L (ref 33–115)
BUN: 12 mg/dL (ref 7–25)
CO2: 28 mmol/L (ref 20–32)
Calcium: 9.3 mg/dL (ref 8.6–10.2)
Chloride: 105 mmol/L (ref 98–110)
Creat: 0.98 mg/dL (ref 0.50–1.10)
Globulin: 2.6 g/dL (calc) (ref 1.9–3.7)
Glucose, Bld: 86 mg/dL (ref 65–99)
Potassium: 4.1 mmol/L (ref 3.5–5.3)
Sodium: 139 mmol/L (ref 135–146)
Total Bilirubin: 0.4 mg/dL (ref 0.2–1.2)
Total Protein: 6.6 g/dL (ref 6.1–8.1)

## 2018-04-05 LAB — HEMOGLOBIN A1C
EAG (MMOL/L): 6.3 (calc)
HEMOGLOBIN A1C: 5.6 %{Hb} (ref <5.7)
Mean Plasma Glucose: 114 (calc)

## 2018-04-10 LAB — PAP, TP IMAGING W/ HPV RNA, RFLX HPV TYPE 16,18/45: HPV DNA High Risk: NOT DETECTED

## 2018-05-10 ENCOUNTER — Ambulatory Visit: Payer: Managed Care, Other (non HMO) | Admitting: Obstetrics & Gynecology

## 2018-05-10 ENCOUNTER — Encounter: Payer: Self-pay | Admitting: Obstetrics & Gynecology

## 2018-05-10 VITALS — BP 126/84

## 2018-05-10 DIAGNOSIS — N6311 Unspecified lump in the right breast, upper outer quadrant: Secondary | ICD-10-CM | POA: Diagnosis not present

## 2018-05-10 NOTE — Progress Notes (Signed)
    Natalie Meyer 1970/12/02 [id number]        48 y.o.  G2P2002   RP: Right breast lump x 1 day  HPI: Patient noticed a right breast lump since last night.  Breast skin normal.  No nipple discharge.  Last screening Mammo 01/2018.  No 1st degree relative with Breast Ca.   OB History  Gravida Para Term Preterm AB Living  2 2 2     2   SAB TAB Ectopic Multiple Live Births               # Outcome Date GA Lbr Len/2nd Weight Sex Delivery Anes PTL Lv  2 Term           1 Term             Past medical history,surgical history, problem list, medications, allergies, family history and social history were all reviewed and documented in the EPIC chart.   Directed ROS with pertinent positives and negatives documented in the history of present illness/assessment and plan.  Exam:  Vitals:   05/10/18 1533  BP: 126/84   General appearance:  Normal  Breast exam:  Rt breast mass 2 x 2 cm, mobile, mildly tender at 10-11 O'clock.  Skin normal.  No nipple discharge.  Rt axilla normal, no LN felt.  Lt breast normal.  Lt axilla normal, no LN felt.   Assessment/Plan:  48 y.o. G2P2002   1. Breast lump on right side at 11 o'clock position Rt breast mass 2 x 2 cm, mobile, mildly tender at 10-11 O'clock.  Skin normal.  No nipple discharge.  Rt axilla normal, no LN felt.  Lt breast normal.  No Fam h/o Breast Ca.  Will schedule Rt Dx Mammo/US.  Counseling on above issues and coordination of care >50% x 15 minutes.  Genia Del MD, 3:38 PM 05/10/2018

## 2018-05-11 ENCOUNTER — Telehealth: Payer: Self-pay | Admitting: *Deleted

## 2018-05-11 DIAGNOSIS — N6310 Unspecified lump in the right breast, unspecified quadrant: Secondary | ICD-10-CM

## 2018-05-11 NOTE — Telephone Encounter (Signed)
-----   Message from Genia Del, MD sent at 05/10/2018  3:50 PM EST ----- Regarding: Refer for Rt Dx Mammo/US Rt breast mass 2 x 2 cm mildly tender mobile at 10-11 O'clock x yesterday.

## 2018-05-11 NOTE — Telephone Encounter (Signed)
Patient preferred to have imaging done at [facility] due to insurance. Last mammogram at [facility] in Nov. 2019. Schedule on 05/14/18 @ 10:30am order faxed to [phone], left detailed message on cell.

## 2018-05-14 ENCOUNTER — Encounter: Payer: Self-pay | Admitting: Obstetrics & Gynecology

## 2018-05-14 NOTE — Patient Instructions (Signed)
1. Breast lump on right side at 11 o'clock position Rt breast mass 2 x 2 cm, mobile, mildly tender at 10-11 O'clock.  Skin normal.  No nipple discharge.  Rt axilla normal, no LN felt.  Lt breast normal.  No Fam h/o Breast Ca.  Will schedule Rt Dx Mammo/US.  Natalie Meyer, it was a pleasure seeing you today!

## 2018-05-14 NOTE — Telephone Encounter (Signed)
Victorino Dike called from [place] asking if order can be faxed to [phone]

## 2018-05-17 ENCOUNTER — Other Ambulatory Visit: Payer: Managed Care, Other (non HMO)

## 2018-12-17 ENCOUNTER — Other Ambulatory Visit: Payer: Self-pay

## 2018-12-18 ENCOUNTER — Encounter: Payer: Self-pay | Admitting: Women's Health

## 2018-12-18 ENCOUNTER — Ambulatory Visit: Payer: Managed Care, Other (non HMO) | Admitting: Women's Health

## 2018-12-18 VITALS — BP 120/80

## 2018-12-18 DIAGNOSIS — L739 Follicular disorder, unspecified: Secondary | ICD-10-CM

## 2018-12-18 NOTE — Progress Notes (Signed)
48 year old MWF G2, P2 presents with complaint of painful bump on right labia for the past week questions if it is HPV.  Regular monthly cycle on alesse.  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  Denies vaginal discharge, itching, odor, urinary symptoms, abdominal/back pain or fever.  No known medical problems.  Exam: Appears well.  External genitalia small less than a centimeter folliculitis on right labia majora, with slight pressure small amount of a white material exuded.    Folliculitis  Plan: Apply warm compresses, over-the-counter Neosporin twice daily, loose clothing, reassured it was not HPV.  Instructed to call if no relief or continued problems.

## 2018-12-18 NOTE — Patient Instructions (Signed)

## 2019-04-09 ENCOUNTER — Other Ambulatory Visit: Payer: Self-pay

## 2019-04-10 ENCOUNTER — Ambulatory Visit: Payer: Managed Care, Other (non HMO) | Admitting: Women's Health

## 2019-04-10 ENCOUNTER — Encounter: Payer: Self-pay | Admitting: Women's Health

## 2019-04-10 VITALS — BP 130/80 | Ht 70.0 in | Wt 161.0 lb

## 2019-04-10 DIAGNOSIS — Z3041 Encounter for surveillance of contraceptive pills: Secondary | ICD-10-CM

## 2019-04-10 DIAGNOSIS — Z01419 Encounter for gynecological examination (general) (routine) without abnormal findings: Secondary | ICD-10-CM | POA: Diagnosis not present

## 2019-04-10 DIAGNOSIS — Z833 Family history of diabetes mellitus: Secondary | ICD-10-CM | POA: Diagnosis not present

## 2019-04-10 MED ORDER — LEVONORGESTREL-ETHINYL ESTRAD 0.1-20 MG-MCG PO TABS: ORAL_TABLET | ORAL | 4 refills | Status: DC

## 2019-04-10 NOTE — Patient Instructions (Signed)
It was good to see you today Vitamin D 1000 IUs daily Health Maintenance, Female Adopting a healthy lifestyle and getting preventive care are important in promoting health and wellness. Ask your health care provider about:  The right schedule for you to have regular tests and exams.  Things you can do on your own to prevent diseases and keep yourself healthy. What should I know about diet, weight, and exercise? Eat a healthy diet   Eat a diet that includes plenty of vegetables, fruits, low-fat dairy products, and lean protein.  Do not eat a lot of foods that are high in solid fats, added sugars, or sodium. Maintain a healthy weight Body mass index (BMI) is used to identify weight problems. It estimates body fat based on height and weight. Your health care provider can help determine your BMI and help you achieve or maintain a healthy weight. Get regular exercise Get regular exercise. This is one of the most important things you can do for your health. Most adults should:  Exercise for at least 150 minutes each week. The exercise should increase your heart rate and make you sweat (moderate-intensity exercise).  Do strengthening exercises at least twice a week. This is in addition to the moderate-intensity exercise.  Spend less time sitting. Even light physical activity can be beneficial. Watch cholesterol and blood lipids Have your blood tested for lipids and cholesterol at 47 years of age, then have this test every 5 years. Have your cholesterol levels checked more often if:  Your lipid or cholesterol levels are high.  You are older than 47 years of age.  You are at high risk for heart disease. What should I know about cancer screening? Depending on your health history and family history, you may need to have cancer screening at various ages. This may include screening for:  Breast cancer.  Cervical cancer.  Colorectal cancer.  Skin cancer.  Lung cancer. What should I  know about heart disease, diabetes, and high blood pressure? Blood pressure and heart disease  High blood pressure causes heart disease and increases the risk of stroke. This is more likely to develop in people who have high blood pressure readings, are of African descent, or are overweight.  Have your blood pressure checked: ? Every 3-5 years if you are 18-39 years of age. ? Every year if you are 40 years old or older. Diabetes Have regular diabetes screenings. This checks your fasting blood sugar level. Have the screening done:  Once every three years after age 40 if you are at a normal weight and have a low risk for diabetes.  More often and at a younger age if you are overweight or have a high risk for diabetes. What should I know about preventing infection? Hepatitis B If you have a higher risk for hepatitis B, you should be screened for this virus. Talk with your health care provider to find out if you are at risk for hepatitis B infection. Hepatitis C Testing is recommended for:  Everyone born from 1945 through 1965.  Anyone with known risk factors for hepatitis C. Sexually transmitted infections (STIs)  Get screened for STIs, including gonorrhea and chlamydia, if: ? You are sexually active and are younger than 47 years of age. ? You are older than 47 years of age and your health care provider tells you that you are at risk for this type of infection. ? Your sexual activity has changed since you were last screened, and you are at   increased risk for chlamydia or gonorrhea. Ask your health care provider if you are at risk.  Ask your health care provider about whether you are at high risk for HIV. Your health care provider may recommend a prescription medicine to help prevent HIV infection. If you choose to take medicine to prevent HIV, you should first get tested for HIV. You should then be tested every 3 months for as long as you are taking the medicine. Pregnancy  If you are  about to stop having your period (premenopausal) and you may become pregnant, seek counseling before you get pregnant.  Take 400 to 800 micrograms (mcg) of folic acid every day if you become pregnant.  Ask for birth control (contraception) if you want to prevent pregnancy. Osteoporosis and menopause Osteoporosis is a disease in which the bones lose minerals and strength with aging. This can result in bone fractures. If you are 65 years old or older, or if you are at risk for osteoporosis and fractures, ask your health care provider if you should:  Be screened for bone loss.  Take a calcium or vitamin D supplement to lower your risk of fractures.  Be given hormone replacement therapy (HRT) to treat symptoms of menopause. Follow these instructions at home: Lifestyle  Do not use any products that contain nicotine or tobacco, such as cigarettes, e-cigarettes, and chewing tobacco. If you need help quitting, ask your health care provider.  Do not use street drugs.  Do not share needles.  Ask your health care provider for help if you need support or information about quitting drugs. Alcohol use  Do not drink alcohol if: ? Your health care provider tells you not to drink. ? You are pregnant, may be pregnant, or are planning to become pregnant.  If you drink alcohol: ? Limit how much you use to 0-1 drink a day. ? Limit intake if you are breastfeeding.  Be aware of how much alcohol is in your drink. In the [place] one drink equals one 12 oz bottle of beer (355 mL), one 5 oz glass of wine (148 mL), or one 1 oz glass of hard liquor (44 mL). General instructions  Schedule regular health, dental, and eye exams.  Stay current with your vaccines.  Tell your health care provider if: ? You often feel depressed. ? You have ever been abused or do not feel safe at home. Summary  Adopting a healthy lifestyle and getting preventive care are important in promoting health and wellness.  Follow  your health care provider's instructions about healthy diet, exercising, and getting tested or screened for diseases.  Follow your health care provider's instructions on monitoring your cholesterol and blood pressure. This information is not intended to replace advice given to you by your health care provider. Make sure you discuss any questions you have with your health care provider. Document Revised: 02/14/2018 Document Reviewed: 02/14/2018 Elsevier Patient Education  2020 [employer]vier Inc.  

## 2019-04-10 NOTE — Progress Notes (Signed)
Natalie Meyer [birth year] [id number]    History:    Presents for annual exam.  Amenorrheic on Loestrin 1/20 continuously.  Normal Pap and mammogram history.  History of benign hematuria with negative work-up.   Past medical history, past surgical history, family history and social history were all reviewed and documented in the EPIC chart.  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  Sons ages 91 and 92 both had Gardasil doing well.  Mother hypertension and diabetes on insulin, slim poor response with p.o. medication..  ROS:  A ROS was performed and pertinent positives and negatives are included.  Exam:  Vitals:   04/10/19 0938  BP: 130/80  Weight: 161 lb (73 kg)  Height: 5\' 10"  (1.778 m)   Body mass index is 23.1 kg/m.   General appearance:  Normal Thyroid:  Symmetrical, normal in size, without palpable masses or nodularity. Respiratory  Auscultation:  Clear without wheezing or rhonchi Cardiovascular  Auscultation:  Regular rate, without rubs, murmurs or gallops  Edema/varicosities:  Not grossly evident Abdominal  Soft,nontender, without masses, guarding or rebound.  Liver/spleen:  No organomegaly noted  Hernia:  None appreciated  Skin  Inspection:  Grossly normal   Breasts: Examined lying and sitting.     Right: Without masses, retractions, discharge or axillary adenopathy.     Left: Without masses, retractions, discharge or axillary adenopathy. Gentitourinary   Inguinal/mons:  Normal without inguinal adenopathy  External genitalia:  Normal  BUS/Urethra/Skene's glands:  Normal  Vagina: Vaginismus   Cervix:  Normal  Uterus:  normal in size, shape and contour.  Midline and mobile  Adnexa/parametria:     Rt: Without masses or tenderness.   Lt: Without masses or tenderness.  Anus and perineum: Normal  Digital rectal exam: Normal sphincter tone without palpated masses or tenderness  Assessment/Plan:  49 y.o. M BF G2, P2 for annual exam with  complaint of discomfort at initiation of intercourse but able to have.   complaints.  Amenorrheic on Loestrin 1/20 continuously Labs primary care Hemoglobin A1c per request/family history mother diabetes on insulin  Plan: Hemoglobin A1c pending.  Loestrin 1/20 prescription, proper use, will continue taking continuously, slight risk of blood clots and strokes reviewed.  Denies any  menopausal symptoms.  SBEs, annual screening mammogram due in March.  Continue healthy lifestyle of healthy diet, regular exercise.  Reviewed importance of relaxation prior to intercourse, over-the-counter lubricants.  Pap normal 2020, new screening guidelines reviewed.    [name], 9:42 AM 04/10/2019

## 2019-04-11 LAB — HEMOGLOBIN A1C
Hgb A1c MFr Bld: 5.5 % of total Hgb (ref <5.7)
Mean Plasma Glucose: 111 (calc)
eAG (mmol/L): 6.2 (calc)

## 2019-04-12 LAB — URINALYSIS, COMPLETE W/RFL CULTURE
Bacteria, UA: NONE SEEN /HPF
Bilirubin Urine: NEGATIVE
Glucose, UA: NEGATIVE
Hyaline Cast: NONE SEEN /LPF
Ketones, ur: NEGATIVE
Leukocyte Esterase: NEGATIVE
Nitrites, Initial: NEGATIVE
Protein, ur: NEGATIVE
Specific Gravity, Urine: 1.017 (ref 1.001–1.03)
WBC, UA: NONE SEEN /HPF (ref 0–5)
pH: 7 (ref 5.0–8.0)

## 2019-04-12 LAB — URINE CULTURE
MICRO NUMBER:: 10114391
SPECIMEN QUALITY:: ADEQUATE

## 2019-04-12 LAB — CULTURE INDICATED

## 2019-05-11 ENCOUNTER — Ambulatory Visit: Payer: Self-pay | Attending: Internal Medicine

## 2019-05-11 DIAGNOSIS — Z23 Encounter for immunization: Secondary | ICD-10-CM | POA: Insufficient documentation

## 2019-05-11 NOTE — Progress Notes (Signed)
   Covid-19 Vaccination Clinic  Name:  Natalie Meyer    MRN: [id number] DOB: 25-Aug-1970  05/11/2019  Ms. Natalie Meyer was observed post Covid-19 immunization for 15 minutes without incident. She was provided with Vaccine Information Sheet and instruction to access the V-Safe system.   Ms. Natalie Meyer was instructed to call 911 with any severe reactions post vaccine: Marland Kitchen Difficulty breathing  . Swelling of face and throat  . A fast heartbeat  . A bad rash all over body  . Dizziness and weakness   Immunizations Administered    Name Date Dose VIS Date Route   Pfizer COVID-19 Vaccine 05/11/2019  3:46 PM 0.3 mL 02/15/2019 Intramuscular   Manufacturer: [employer], [employer]   Lot: [id number]   NDC: [address]-2

## 2019-06-02 ENCOUNTER — Ambulatory Visit: Payer: Self-pay | Attending: Internal Medicine

## 2019-06-02 DIAGNOSIS — Z23 Encounter for immunization: Secondary | ICD-10-CM

## 2019-06-02 NOTE — Progress Notes (Signed)
   Covid-19 Vaccination Clinic  Name:  Natalie Meyer    MRN: [id number] DOB: 05-Jul-1970  06/02/2019  Ms. Natalie Meyer was observed post Covid-19 immunization for 15 minutes without incident. She was provided with Vaccine Information Sheet and instruction to access the V-Safe system.   Ms. Natalie Meyer was instructed to call 911 with any severe reactions post vaccine: Marland Kitchen Difficulty breathing  . Swelling of face and throat  . A fast heartbeat  . A bad rash all over body  . Dizziness and weakness   Immunizations Administered    Name Date Dose VIS Date Route   Pfizer COVID-19 Vaccine 06/02/2019  2:25 PM 0.3 mL 02/15/2019 Intramuscular   Manufacturer: [employer], [employer]   Lot: [id number]   NDC: [address]-2

## 2019-06-11 ENCOUNTER — Ambulatory Visit: Payer: Self-pay

## 2019-12-18 ENCOUNTER — Other Ambulatory Visit: Payer: Self-pay

## 2019-12-18 DIAGNOSIS — Z3041 Encounter for surveillance of contraceptive pills: Secondary | ICD-10-CM

## 2019-12-18 MED ORDER — LEVONORGESTREL-ETHINYL ESTRAD 0.1-20 MG-MCG PO TABS: ORAL_TABLET | ORAL | 1 refills | Status: DC
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
## 2020-04-13 ENCOUNTER — Ambulatory Visit (INDEPENDENT_AMBULATORY_CARE_PROVIDER_SITE_OTHER): Payer: 59 | Admitting: Nurse Practitioner

## 2020-04-13 ENCOUNTER — Encounter: Payer: Self-pay | Admitting: Nurse Practitioner

## 2020-04-13 ENCOUNTER — Other Ambulatory Visit: Payer: Self-pay

## 2020-04-13 VITALS — BP 110/74 | HR 90 | Resp 16 | Ht 70.5 in | Wt 158.0 lb

## 2020-04-13 DIAGNOSIS — Z8249 Family history of ischemic heart disease and other diseases of the circulatory system: Secondary | ICD-10-CM

## 2020-04-13 DIAGNOSIS — Z3041 Encounter for surveillance of contraceptive pills: Secondary | ICD-10-CM

## 2020-04-13 DIAGNOSIS — R7303 Prediabetes: Secondary | ICD-10-CM | POA: Diagnosis not present

## 2020-04-13 DIAGNOSIS — Z01419 Encounter for gynecological examination (general) (routine) without abnormal findings: Secondary | ICD-10-CM | POA: Diagnosis not present

## 2020-04-13 MED ORDER — LEVONORGESTREL-ETHINYL ESTRAD 0.1-20 MG-MCG PO TABS: ORAL_TABLET | ORAL | 3 refills | Status: DC

## 2020-04-13 NOTE — Patient Instructions (Signed)
Health Maintenance, Female Adopting a healthy lifestyle and getting preventive care are important in promoting health and wellness. Ask your health care provider about:  The right schedule for you to have regular tests and exams.  Things you can do on your own to prevent diseases and keep yourself healthy. What should I know about diet, weight, and exercise? Eat a healthy diet  Eat a diet that includes plenty of vegetables, fruits, low-fat dairy products, and lean protein.  Do not eat a lot of foods that are high in solid fats, added sugars, or sodium.   Maintain a healthy weight Body mass index (BMI) is used to identify weight problems. It estimates body fat based on height and weight. Your health care provider can help determine your BMI and help you achieve or maintain a healthy weight. Get regular exercise Get regular exercise. This is one of the most important things you can do for your health. Most adults should:  Exercise for at least 150 minutes each week. The exercise should increase your heart rate and make you sweat (moderate-intensity exercise).  Do strengthening exercises at least twice a week. This is in addition to the moderate-intensity exercise.  Spend less time sitting. Even light physical activity can be beneficial. Watch cholesterol and blood lipids Have your blood tested for lipids and cholesterol at 48 years of age, then have this test every 5 years. Have your cholesterol levels checked more often if:  Your lipid or cholesterol levels are high.  You are older than 48 years of age.  You are at high risk for heart disease. What should I know about cancer screening? Depending on your health history and family history, you may need to have cancer screening at various ages. This may include screening for:  Breast cancer.  Cervical cancer.  Colorectal cancer.  Skin cancer.  Lung cancer. What should I know about heart disease, diabetes, and high blood  pressure? Blood pressure and heart disease  High blood pressure causes heart disease and increases the risk of stroke. This is more likely to develop in people who have high blood pressure readings, are of African descent, or are overweight.  Have your blood pressure checked: ? Every 3-5 years if you are 18-39 years of age. ? Every year if you are 40 years old or older. Diabetes Have regular diabetes screenings. This checks your fasting blood sugar level. Have the screening done:  Once every three years after age 40 if you are at a normal weight and have a low risk for diabetes.  More often and at a younger age if you are overweight or have a high risk for diabetes. What should I know about preventing infection? Hepatitis B If you have a higher risk for hepatitis B, you should be screened for this virus. Talk with your health care provider to find out if you are at risk for hepatitis B infection. Hepatitis C Testing is recommended for:  Everyone born from 1945 through 1965.  Anyone with known risk factors for hepatitis C. Sexually transmitted infections (STIs)  Get screened for STIs, including gonorrhea and chlamydia, if: ? You are sexually active and are younger than 48 years of age. ? You are older than 48 years of age and your health care provider tells you that you are at risk for this type of infection. ? Your sexual activity has changed since you were last screened, and you are at increased risk for chlamydia or gonorrhea. Ask your health care provider   if you are at risk.  Ask your health care provider about whether you are at high risk for HIV. Your health care provider may recommend a prescription medicine to help prevent HIV infection. If you choose to take medicine to prevent HIV, you should first get tested for HIV. You should then be tested every 3 months for as long as you are taking the medicine. Pregnancy  If you are about to stop having your period (premenopausal) and  you may become pregnant, seek counseling before you get pregnant.  Take 400 to 800 micrograms (mcg) of folic acid every day if you become pregnant.  Ask for birth control (contraception) if you want to prevent pregnancy. Osteoporosis and menopause Osteoporosis is a disease in which the bones lose minerals and strength with aging. This can result in bone fractures. If you are 65 years old or older, or if you are at risk for osteoporosis and fractures, ask your health care provider if you should:  Be screened for bone loss.  Take a calcium or vitamin D supplement to lower your risk of fractures.  Be given hormone replacement therapy (HRT) to treat symptoms of menopause. Follow these instructions at home: Lifestyle  Do not use any products that contain nicotine or tobacco, such as cigarettes, e-cigarettes, and chewing tobacco. If you need help quitting, ask your health care provider.  Do not use street drugs.  Do not share needles.  Ask your health care provider for help if you need support or information about quitting drugs. Alcohol use  Do not drink alcohol if: ? Your health care provider tells you not to drink. ? You are pregnant, may be pregnant, or are planning to become pregnant.  If you drink alcohol: ? Limit how much you use to 0-1 drink a day. ? Limit intake if you are breastfeeding.  Be aware of how much alcohol is in your drink. In the [place] one drink equals one 12 oz bottle of beer (355 mL), one 5 oz glass of wine (148 mL), or one 1 oz glass of hard liquor (44 mL). General instructions  Schedule regular health, dental, and eye exams.  Stay current with your vaccines.  Tell your health care provider if: ? You often feel depressed. ? You have ever been abused or do not feel safe at home. Summary  Adopting a healthy lifestyle and getting preventive care are important in promoting health and wellness.  Follow your health care provider's instructions about healthy  diet, exercising, and getting tested or screened for diseases.  Follow your health care provider's instructions on monitoring your cholesterol and blood pressure. This information is not intended to replace advice given to you by your health care provider. Make sure you discuss any questions you have with your health care provider. Document Revised: 02/14/2018 Document Reviewed: 02/14/2018 Elsevier Patient Education  2021 [employer] Inc.  

## 2020-04-14 LAB — COMPREHENSIVE METABOLIC PANEL
AG Ratio: 1.6 (calc) (ref 1.0–2.5)
ALT: 12 U/L (ref 6–29)
AST: 18 U/L (ref 10–35)
Albumin: 4.1 g/dL (ref 3.6–5.1)
Alkaline phosphatase (APISO): 67 U/L (ref 31–125)
BUN/Creatinine Ratio: 8 (calc) (ref 6–22)
BUN: 10 mg/dL (ref 7–25)
CO2: 25 mmol/L (ref 20–32)
Calcium: 8.8 mg/dL (ref 8.6–10.2)
Chloride: 105 mmol/L (ref 98–110)
Creat: 1.19 mg/dL — ABNORMAL HIGH (ref 0.50–1.10)
Globulin: 2.5 g/dL (calc) (ref 1.9–3.7)
Glucose, Bld: 90 mg/dL (ref 65–99)
Potassium: 3.6 mmol/L (ref 3.5–5.3)
Sodium: 137 mmol/L (ref 135–146)
Total Bilirubin: 0.6 mg/dL (ref 0.2–1.2)
Total Protein: 6.6 g/dL (ref 6.1–8.1)

## 2020-04-14 LAB — CBC
HCT: 39 % (ref 35.0–45.0)
Hemoglobin: 13.6 g/dL (ref 11.7–15.5)
MCH: 32.3 pg (ref 27.0–33.0)
MCHC: 34.9 g/dL (ref 32.0–36.0)
MCV: 92.6 fL (ref 80.0–100.0)
MPV: 10.9 fL (ref 7.5–12.5)
Platelets: 229 10*3/uL (ref 140–400)
RBC: 4.21 10*6/uL (ref 3.80–5.10)
RDW: 11.9 % (ref 11.0–15.0)
WBC: 5 10*3/uL (ref 3.8–10.8)

## 2020-04-14 LAB — LIPID PANEL
Cholesterol: 133 mg/dL (ref <200)
HDL: 58 mg/dL (ref >=50)
LDL Cholesterol (Calc): 62 mg/dL (calc)
Non-HDL Cholesterol (Calc): 75 mg/dL (calc) (ref <130)
Total CHOL/HDL Ratio: 2.3 (calc) (ref <5.0)
Triglycerides: 45 mg/dL (ref <150)

## 2020-04-14 LAB — HEMOGLOBIN A1C W/OUT EAG: Hgb A1c MFr Bld: 5.4 % of total Hgb (ref <5.7)

## 2020-09-09 ENCOUNTER — Other Ambulatory Visit: Payer: Self-pay

## 2020-09-09 ENCOUNTER — Encounter: Payer: Self-pay | Admitting: Obstetrics and Gynecology

## 2020-09-09 ENCOUNTER — Ambulatory Visit: Payer: 59 | Admitting: Obstetrics and Gynecology

## 2020-09-09 VITALS — BP 122/74 | HR 70 | Ht 70.0 in | Wt 154.0 lb

## 2020-09-09 DIAGNOSIS — R232 Flushing: Secondary | ICD-10-CM | POA: Diagnosis not present

## 2020-09-09 DIAGNOSIS — N912 Amenorrhea, unspecified: Secondary | ICD-10-CM

## 2020-09-09 DIAGNOSIS — R5383 Other fatigue: Secondary | ICD-10-CM | POA: Diagnosis not present

## 2020-09-09 DIAGNOSIS — R61 Generalized hyperhidrosis: Secondary | ICD-10-CM

## 2020-09-09 DIAGNOSIS — R6882 Decreased libido: Secondary | ICD-10-CM

## 2020-09-09 LAB — PREGNANCY, URINE: Preg Test, Ur: NEGATIVE

## 2020-09-09 MED ORDER — ESTRADIOL 0.05 MG/24HR TD PTTW: 1.0000 | MEDICATED_PATCH | TRANSDERMAL | 1 refills | Status: DC

## 2020-09-09 MED ORDER — PROMETRIUM 100 MG PO CAPS: 100.0000 mg | ORAL_CAPSULE | Freq: Every day | ORAL | 1 refills | Status: DC

## 2020-09-09 NOTE — Progress Notes (Signed)
GYNECOLOGY  VISIT   HPI: 50 y.o.   Married Black or [place] American Not Hispanic or Latino  female   [phone] with Patient's last menstrual period was 06/19/2020 (approximate).   here for amenorrhea  she stopped OCPS in April after her husband got a vasectomy. He had the vasectomy in 11/22, she didn't go off of OCP's until her husband was cleared.  She was on OCP's for 30 years with short, regular cycles. Since going off, no cycles, she is having headaches, feels irritable, feels out of wack since going off of OCP's.  She is having hot flashes for the last few weeks, not sleeping well, waking up 10 x at night, having night sweats (damp).   One of the reasons she went off of OCP's was because of her low libido, since going off of her pills her libido is zero.  She always needs to use lubrication with intercourse, she is always tight. Since she went off of OCP's she is having vaginal burning and discomfort after intercourse.   She does have issues with constipation, now with worsening constipation. She is having dryness in her hair and fatigue.   No galactorrhea, no hirsutism.    GYNECOLOGIC HISTORY: Patient's last menstrual period was 06/19/2020 (approximate). Contraception:Vasectomy  Menopausal hormone therapy: none         OB History     Gravida  2   Para  2   Term  2   Preterm      AB      Living  2      SAB      IAB      Ectopic      Multiple      Live Births                 Patient Active Problem List   Diagnosis Date Noted   Benign hematuria 02/14/2014   Hemorrhoid     Past Medical History:  Diagnosis Date   Hemorrhoid     Past Surgical History:  Procedure Laterality Date   CESAREAN SECTION     x2    Current Outpatient Medications  Medication Sig Dispense Refill   ALPRAZolam (XANAX) 0.25 MG tablet Take by mouth.     Ascorbic Acid (VITA-C PO) Take 1 tablet by mouth daily.     MAGNESIUM PO Take 1 tablet by mouth daily.     vitamin B-12  (CYANOCOBALAMIN) 1000 MCG tablet Take by mouth.     VITAMIN D PO Take 1 tablet by mouth daily.     Vitamin D-Vitamin K (K2 PLUS D3) [phone] MCG-UNIT TABS Take by mouth.     Zinc 10 MG LOZG Use as directed in the mouth or throat daily.     Current Facility-Administered Medications  Medication Dose Route Frequency Provider Last Rate Last Admin   nystatin cream (MYCOSTATIN)   Topical BID Harrington Challenger, NP         ALLERGIES: Patient has no known allergies.  Family History  Problem Relation Age of Onset   Hypertension Mother    Diabetes Mother        type 1   Cancer Paternal Aunt        COLON CANCER   Diabetes Maternal Grandmother    Cancer Paternal Grandmother        STOMACH CANCER   Heart disease Paternal Grandfather     Social History   Socioeconomic History   Marital status: Married    Spouse  name: Not on file   Number of children: Not on file   Years of education: Not on file   Highest education level: Not on file  Occupational History   Not on file  Tobacco Use   Smoking status: Never   Smokeless tobacco: Never  Vaping Use   Vaping Use: Never used  Substance and Sexual Activity   Alcohol use: No    Alcohol/week: 0.0 standard drinks   Drug use: No   Sexual activity: Yes    Birth control/protection: Pill    Comment: first sexual encounter at age 33, less than five partners in a lifetime  Other Topics Concern   Not on file  Social History Narrative   Not on file   Social Determinants of Health   Financial Resource Strain: Not on file  Food Insecurity: Not on file  Transportation Needs: Not on file  Physical Activity: Not on file  Stress: Not on file  Social Connections: Not on file  Intimate Partner Violence: Not on file    Review of Systems  All other systems reviewed and are negative.  PHYSICAL EXAMINATION:    BP 122/74   Pulse 70   Ht 5\' 10"  (1.778 m)   Wt 154 lb (69.9 kg)   LMP 06/19/2020 (Approximate)   SpO2 99%   BMI 22.10 kg/m      General appearance: alert, cooperative and appears stated age Neck: no adenopathy, supple, symmetrical, trachea midline and thyroid normal to inspection and palpation  1. Amenorrhea No cycles since stopping OCP's. Symptoms c/w perimenopause or menopause.  - Pregnancy, urine - TSH - Prolactin - Follicle stimulating hormone  2. Hot flashes We discussed options of restarting OCP's or starting HRT. Reviewed risks, no contraindications. She would like to start HRT, given the severity of her symptoms will start on the 0.05 mg estradiol patch. Will start daily Prometrium, we discussed that we may need to change it to cyclic depending on her blood work or BTB.  - Follicle stimulating hormone - Estradiol - estradiol (VIVELLE-DOT) 0.05 MG/24HR patch; Place 1 patch (0.05 mg total) onto the skin 2 (two) times a week.  Dispense: 8 patch; Refill: 1 - PROMETRIUM 100 MG capsule; Take 1 capsule (100 mg total) by mouth daily.  Dispense: 30 capsule; Refill: 1  3. Night sweats See above - Follicle stimulating hormone - Estradiol - estradiol (VIVELLE-DOT) 0.05 MG/24HR patch; Place 1 patch (0.05 mg total) onto the skin 2 (two) times a week.  Dispense: 8 patch; Refill: 1 - PROMETRIUM 100 MG capsule; Take 1 capsule (100 mg total) by mouth daily.  Dispense: 30 capsule; Refill: 1  4. Other fatigue - TSH  5. Low libido Discussed libido, information given - Testos,Total,Free and SHBG (Female) -we discussed compounded testosterone. Aware it is off label. Discussed risks.  -if her level is low will start the testosterone cream and have her return for f/u lab work in one month  Over 30 minutes in total patient care.

## 2020-09-09 NOTE — Patient Instructions (Signed)
Menopause and Hormone Replacement Therapy Menopause is a normal time of life when menstrual periods stop completely and the ovaries stop producing the female hormones estrogen and progesterone. Low levels of these hormones can affect your health and cause symptoms. Hormonereplacement therapy (HRT) can relieve some of those symptoms. HRT is the use of artificial (synthetic) hormones to replace hormones that your body has stopped producing because youhave reached menopause. Types of HRT  HRT may consist of the synthetic hormones estrogen and progestin, or it may consist of estrogen-only therapy. You and your health care provider will decidewhich form of HRT is best for you. If you choose to be on HRT and you have a uterus, estrogen and progestin are usually prescribed. Estrogen-only therapy is used for women who do not have auterus. Possible options for taking HRT include: Pills. Patches. Gels. Sprays. Vaginal cream. Vaginal rings. Vaginal inserts. The amount of hormones that you take and how long you take them varies according to your health. It is important to: Begin HRT with the lowest possible dosage. Stop HRT as soon as your health care provider tells you to stop. Work with your health care provider so that you feel informed and comfortable with your decisions. Tell a health care provider about: Any allergies you have. Whether you have had blood clots or know of any risk factors you may have for blood clots. Whether you or family members have had cancer, especially cancer of the breasts, ovaries, or uterus. Any surgeries you have had. All medicines you are taking, including vitamins, herbs, eye drops, creams, and over-the-counter medicines. Whether you are pregnant or may be pregnant. Any medical conditions you have. What are the benefits? HRT can reduce the frequency and severity of menopausal symptoms. Benefits of HRT vary according to the kind of symptoms that you have, how severe  they are, and your overall health. HRT may help to improve the following symptoms of menopause: Hot flashes and night sweats. These are sudden feelings of heat that spread over the face and body. The skin may turn red, like a blush. Night sweats are hot flashes that happen while you are sleeping or trying to sleep. Bone loss (osteoporosis). The body loses calcium more quickly after menopause, causing the bones to become weaker. This can increase the risk for bone breaks (fractures). Vaginal dryness. The lining of the vagina can become thin and dry, which can cause pain during sex or cause infection, burning, or itching. Urinary tract infections. Urinary incontinence. This is the inability to control when you urinate. Irritability. Short-term memory problems. What are the risks? Risks of HRT vary depending on your individual health and medical history. Risks of HRT also depend on whether you receive both estrogen and progestin or you receive estrogen only. HRT may increase the risk of: Spotting. This is when a small amount of blood leaks from the vagina unexpectedly. Endometrial cancer. This cancer is in the lining of the uterus (endometrium). Breast cancer. Increased density of breast tissue. This can make it harder to find breast cancer on a breast X-ray (mammogram). Stroke. Heart disease. Blood clots. Gallbladder disease or liver disease. Risks of HRT can increase if you have any of the following conditions: Endometrial cancer. Liver disease. Heart disease. Breast cancer. History of blood clots. History of stroke. Follow these instructions at home: Pap tests Have Pap tests done as often as told by your health care provider. A Pap test is sometimes called a Pap smear. It is a screening test that   is used to check for signs of cancer of the cervix and vagina. A Pap test can also identify the presence of infection or precancerous changes. Pap tests may be done: Every 3 years, starting at  age 48. Every 5 years, starting after age 48, in combination with testing for human papillomavirus (HPV). More often or less often depending on other medical conditions you have, your age, and other risk factors. It is up to you to get the results of your Pap test. Ask your health care provider, or the department that is doing the test, when your results will be ready. General instructions Take over-the-counter and prescription medicines only as told by your health care provider. Do not use any products that contain nicotine or tobacco. These products include cigarettes, chewing tobacco, and vaping devices, such as e-cigarettes. If you need help quitting, ask your health care provider. Get mammograms, pelvic exams, and medical checkups as often as told by your health care provider. Keep all follow-up visits. This is important. Contact a health care provider if you have: Pain or swelling in your legs. Lumps or changes in your breasts or armpits. Pain, burning, or bleeding when you urinate. Unusual vaginal bleeding. Dizziness or headaches. Pain in your abdomen. Get help right away if you have: Shortness of breath. Chest pain. Slurred speech. Weakness or numbness in any part of your arms or legs. These symptoms may represent a serious problem that is an emergency. Do not wait to see if the symptoms will go away. Get medical help right away. Call your local emergency services (911 in the [place] Do not drive yourself to the hospital. Summary Menopause is a normal time of life when menstrual periods stop completely and the ovaries stop producing the female hormones estrogen and progesterone. HRT can reduce the frequency and severity of menopausal symptoms. Risks of HRT vary depending on your individual health and medical history. This information is not intended to replace advice given to you by your health care provider. Make sure you discuss any questions you have with your healthcare  provider. Document Revised: 08/26/2019 Document Reviewed: 08/26/2019 Elsevier Patient Education  2022 Elsevier Inc. https://www.womenshealth.gov/menopause/menopause-basics"> https://www.clinicalkey.com">  Menopause Menopause is the normal time of a woman's life when menstrual periods stop completely. It marks the natural end to a woman's ability to become pregnant. It can be defined as the absence of a menstrual period for 12 months without another medical cause. The transition to menopause (perimenopause) most often happens between the ages of 43 and 74, and can last for many years. During perimenopause, hormone levels change in your body, which can cause symptoms and affect your health. Menopause may increase your risk for: Weakened bones (osteoporosis), which causes fractures. Depression. Hardening and narrowing of the arteries (atherosclerosis), which can cause heart attacks and strokes. What are the causes? This condition is usually caused by a natural change in hormone levels that happens as you get older. The condition may also be caused by changes that are not natural, including: Surgery to remove both ovaries (surgical menopause). Side effects from some medicines, such as chemotherapy used to treat cancer (chemical menopause). What increases the risk? This condition is more likely to start at an earlier age if you have certain medical conditions or have undergone treatments, including: A tumor of the pituitary gland in the brain. A disease that affects the ovaries and hormones. Certain cancer treatments, such as chemotherapy or hormone therapy, or radiation therapy on the pelvis. Heavy smoking and excessive alcohol  use. Family history of early menopause. This condition is also more likely to develop earlier in women who are verythin. What are the signs or symptoms? Symptoms of this condition include: Hot flashes. Irregular menstrual periods. Night sweats. Changes in feelings about  sex. This could be a decrease in sex drive or an increased discomfort around your sexuality. Vaginal dryness and thinning of the vaginal walls. This may cause painful sex. Dryness of the skin and development of wrinkles. Headaches. Problems sleeping (insomnia). Mood swings or irritability. Memory problems. Weight gain. Hair growth on the face and chest. Bladder infections or problems with urinating. How is this diagnosed? This condition is diagnosed based on your medical history, a physical exam, your age, your menstrual history, and your symptoms. Hormone tests may also bedone. How is this treated? In some cases, no treatment is needed. You and your health care provider should make a decision together about whether treatment is necessary. Treatment will be based on your individual condition and preferences. Treatment for this condition focuses on managing symptoms. Treatment may include: Menopausal hormone therapy (MHT). Medicines to treat specific symptoms or complications. Acupuncture. Vitamin or herbal supplements. Before starting treatment, make sure to let your health care provider know if you have a personal or family history of these conditions: Heart disease. Breast cancer. Blood clots. Diabetes. Osteoporosis. Follow these instructions at home: Lifestyle Do not use any products that contain nicotine or tobacco, such as cigarettes, e-cigarettes, and chewing tobacco. If you need help quitting, ask your health care provider. Get at least 30 minutes of physical activity on 5 or more days each week. Avoid alcoholic and caffeinated beverages, as well as spicy foods. This may help prevent hot flashes. Get 7-8 hours of sleep each night. If you have hot flashes, try: Dressing in layers. Avoiding things that may trigger hot flashes, such as spicy food, warm places, or stress. Taking slow, deep breaths when a hot flash starts. Keeping a fan in your home and office. Find ways to  manage stress, such as deep breathing, meditation, or journaling. Consider going to group therapy with other women who are having menopause symptoms. Ask your health care provider about recommended group therapy meetings. Eating and drinking  Eat a healthy, balanced diet that contains whole grains, lean protein, low-fat dairy, and plenty of fruits and vegetables. Your health care provider may recommend adding more soy to your diet. Foods that contain soy include tofu, tempeh, and soy milk. Eat plenty of foods that contain calcium and vitamin D for bone health. Items that are rich in calcium include low-fat milk, yogurt, beans, almonds, sardines, broccoli, and kale.  Medicines Take over-the-counter and prescription medicines only as told by your health care provider. Talk with your health care provider before starting any herbal supplements. If prescribed, take vitamins and supplements as told by your health care provider. General instructions  Keep track of your menstrual periods, including: When they occur. How heavy they are and how long they last. How much time passes between periods. Keep track of your symptoms, noting when they start, how often you have them, and how long they last. Use vaginal lubricants or moisturizers to help with vaginal dryness and improve comfort during sex. Keep all follow-up visits. This is important. This includes any group therapy or counseling.  Contact a health care provider if: You are still having menstrual periods after age 24. You have pain during sex. You have not had a period for 12 months and you develop vaginal  bleeding. Get help right away if you have: Severe depression. Excessive vaginal bleeding. Pain when you urinate. A fast or irregular heartbeat (palpitations). Severe headaches. Abdominal pain or severe indigestion. Summary Menopause is a normal time of life when menstrual periods stop completely. It is usually defined as the absence of  a menstrual period for 12 months without another medical cause. The transition to menopause (perimenopause) most often happens between the ages of 19 and 31 and can last for several years. Symptoms can be managed through medicines, lifestyle changes, and complementary therapies such as acupuncture. Eat a balanced diet that is rich in nutrients to promote bone health and heart health and to manage symptoms during menopause. This information is not intended to replace advice given to you by your health care provider. Make sure you discuss any questions you have with your healthcare provider. Document Revised: 11/22/2019 Document Reviewed: 08/08/2019 Elsevier Patient Education  2022 [employer].

## 2020-09-10 ENCOUNTER — Encounter: Payer: Self-pay | Admitting: Obstetrics and Gynecology

## 2020-09-10 DIAGNOSIS — R61 Generalized hyperhidrosis: Secondary | ICD-10-CM

## 2020-09-10 DIAGNOSIS — R232 Flushing: Secondary | ICD-10-CM

## 2020-09-10 NOTE — Telephone Encounter (Signed)
Dr.Jertson mail order requires 90 day supply for estradiol and progesterone.

## 2020-09-11 ENCOUNTER — Encounter: Payer: Self-pay | Admitting: Obstetrics and Gynecology

## 2020-09-11 MED ORDER — ESTRADIOL 0.05 MG/24HR TD PTTW: 1.0000 | MEDICATED_PATCH | TRANSDERMAL | 0 refills | Status: DC

## 2020-09-11 MED ORDER — PROMETRIUM 100 MG PO CAPS: 100.0000 mg | ORAL_CAPSULE | Freq: Every day | ORAL | 0 refills | Status: DC

## 2020-09-11 NOTE — Telephone Encounter (Signed)
Dr.Jertson now patient is requesting weekly patch and generic progesterone.

## 2020-09-13 LAB — FOLLICLE STIMULATING HORMONE: FSH: 161.7 m[IU]/mL — ABNORMAL HIGH

## 2020-09-13 LAB — TESTOS,TOTAL,FREE AND SHBG (FEMALE)
Free Testosterone: 2.3 pg/mL (ref 0.1–6.4)
Sex Hormone Binding: 43 nmol/L (ref 17–124)
Testosterone, Total, LC-MS-MS: 16 ng/dL (ref 2–45)

## 2020-09-13 LAB — TSH: TSH: 0.98 mIU/L

## 2020-09-13 LAB — ESTRADIOL: Estradiol: 17 pg/mL

## 2020-09-13 LAB — PROLACTIN: Prolactin: 7.9 ng/mL

## 2020-09-14 ENCOUNTER — Other Ambulatory Visit: Payer: Self-pay

## 2020-09-14 ENCOUNTER — Other Ambulatory Visit: Payer: Self-pay | Admitting: Obstetrics and Gynecology

## 2020-09-14 DIAGNOSIS — R6882 Decreased libido: Secondary | ICD-10-CM

## 2020-09-14 MED ORDER — ESTRADIOL 0.05 MG/24HR TD PTWK: 0.0500 mg | MEDICATED_PATCH | TRANSDERMAL | 1 refills | Status: DC

## 2020-09-14 MED ORDER — PROGESTERONE MICRONIZED 100 MG PO CAPS: 100.0000 mg | ORAL_CAPSULE | Freq: Every day | ORAL | 1 refills | Status: DC

## 2020-09-14 MED ORDER — NONFORMULARY OR COMPOUNDED ITEM: 0 refills | Status: DC

## 2020-09-14 NOTE — Telephone Encounter (Signed)
Dr.Jertson replied "Please send in a one month supply of climara 0.05 mg patch, apply to the lower abdomen, change weekly #4, 1 refill. Please also send in the generic progesterone 100 mg, 1 tab po qhs, #30 with one refill. She wants this to go to [facility]. She should f/u in 1 month.  Thanks,  Noreene Larsson "

## 2020-10-06 ENCOUNTER — Other Ambulatory Visit: Payer: Self-pay | Admitting: Obstetrics and Gynecology

## 2020-10-06 ENCOUNTER — Encounter: Payer: Self-pay | Admitting: Obstetrics and Gynecology

## 2020-10-06 DIAGNOSIS — R61 Generalized hyperhidrosis: Secondary | ICD-10-CM

## 2020-10-06 DIAGNOSIS — R232 Flushing: Secondary | ICD-10-CM

## 2020-10-06 NOTE — Telephone Encounter (Signed)
Spoke with pharmacy and they have Rx ready for pick up and it has a refill on this Rx.

## 2020-10-07 MED ORDER — ESTRADIOL 0.05 MG/24HR TD PTWK: 0.0500 mg | MEDICATED_PATCH | TRANSDERMAL | 0 refills | Status: DC

## 2020-10-14 ENCOUNTER — Encounter: Payer: Self-pay | Admitting: Obstetrics and Gynecology

## 2020-10-14 ENCOUNTER — Other Ambulatory Visit: Payer: Self-pay

## 2020-10-14 ENCOUNTER — Ambulatory Visit: Payer: 59 | Admitting: Obstetrics and Gynecology

## 2020-10-14 VITALS — BP 124/66 | HR 88 | Ht 70.0 in | Wt 154.0 lb

## 2020-10-14 DIAGNOSIS — N951 Menopausal and female climacteric states: Secondary | ICD-10-CM

## 2020-10-14 DIAGNOSIS — Z79890 Hormone replacement therapy (postmenopausal): Secondary | ICD-10-CM | POA: Diagnosis not present

## 2020-10-14 DIAGNOSIS — R6882 Decreased libido: Secondary | ICD-10-CM

## 2020-10-14 MED ORDER — PROGESTERONE MICRONIZED 100 MG PO CAPS: 100.0000 mg | ORAL_CAPSULE | Freq: Every day | ORAL | 1 refills | Status: DC

## 2020-10-14 MED ORDER — ESTRADIOL 0.05 MG/24HR TD PTWK: 0.0500 mg | MEDICATED_PATCH | TRANSDERMAL | 1 refills | Status: DC

## 2020-10-14 NOTE — Progress Notes (Signed)
GYNECOLOGY  VISIT   HPI: 50 y.o.   Married Black or [place] American Not Hispanic or Latino  female   [phone] with Patient's last menstrual period was 06/05/2020.   here for 1 month follow up on HRT. She had amenorrhea after stopping pills in 4/22. Last month she was seen c/o vasomotor symptoms, irritability, mood changes. She also c/o an absent libido.  FSH was 161. Normal prolactin and TSH. Testosterone levels were low normal and she was started on testosterone.  She is on the climara patch 0.05 mg, daily Prometrium and compounded testosterone. She feels 90% better. No hot flashes. Still have some headaches, but was having some headaches on OCP's. No more vaginal dryness, sleeping fine. Not crying, feels emotionally stable.  Her libido has improved since starting the testosterone 3 weeks ago.     GYNECOLOGIC HISTORY: Patient's last menstrual period was 06/05/2020. Contraception:None  Menopausal hormone therapy: yes estradiol patch testosterone cream, Progesterone         OB History     Gravida  2   Para  2   Term  2   Preterm      AB      Living  2      SAB      IAB      Ectopic      Multiple      Live Births                 Patient Active Problem List   Diagnosis Date Noted   Family history of diabetes mellitus 12/06/2017   Tension type headache 03/20/2014   Eczema 03/20/2014   Depression with anxiety 03/20/2014   Benign hematuria 02/14/2014   Hemorrhoid     Past Medical History:  Diagnosis Date   Hemorrhoid     Past Surgical History:  Procedure Laterality Date   CESAREAN SECTION     x2    Current Outpatient Medications  Medication Sig Dispense Refill   ALPRAZolam (XANAX) 0.25 MG tablet Take by mouth.     Ascorbic Acid (VITA-C PO) Take 1 tablet by mouth daily.     estradiol (CLIMARA - DOSED IN MG/24 HR) 0.05 mg/24hr patch Place 1 patch (0.05 mg total) onto the skin once a week. 4 patch 0   MAGNESIUM PO Take 1 tablet by mouth daily.      NONFORMULARY OR COMPOUNDED ITEM Compounded testosterone cream 1%  15 gram tube S:  apply 0.5 grams daily to lower abdomen. 15 each 0   progesterone (PROMETRIUM) 100 MG capsule Take 1 capsule (100 mg total) by mouth at bedtime. 30 capsule 1   vitamin B-12 (CYANOCOBALAMIN) 1000 MCG tablet Take by mouth.     VITAMIN D PO Take 1 tablet by mouth daily.     Vitamin D-Vitamin K (K2 PLUS D3) [phone] MCG-UNIT TABS Take by mouth.     Zinc 10 MG LOZG Use as directed in the mouth or throat daily.     Current Facility-Administered Medications  Medication Dose Route Frequency Provider Last Rate Last Admin   nystatin cream (MYCOSTATIN)   Topical BID Harrington Challenger, NP         ALLERGIES: Patient has no known allergies.  Family History  Problem Relation Age of Onset   Hypertension Mother    Diabetes Mother        type 1   Cancer Paternal Aunt        COLON CANCER   Diabetes Maternal Grandmother  Cancer Paternal Grandmother        STOMACH CANCER   Heart disease Paternal Grandfather     Social History   Socioeconomic History   Marital status: Married    Spouse name: Not on file   Number of children: Not on file   Years of education: Not on file   Highest education level: Not on file  Occupational History   Not on file  Tobacco Use   Smoking status: Never   Smokeless tobacco: Never  Vaping Use   Vaping Use: Never used  Substance and Sexual Activity   Alcohol use: No    Alcohol/week: 0.0 standard drinks   Drug use: No   Sexual activity: Yes    Birth control/protection: Pill    Comment: first sexual encounter at age 89, less than five partners in a lifetime  Other Topics Concern   Not on file  Social History Narrative   Not on file   Social Determinants of Health   Financial Resource Strain: Not on file  Food Insecurity: Not on file  Transportation Needs: Not on file  Physical Activity: Not on file  Stress: Not on file  Social Connections: Not on file  Intimate Partner  Violence: Not on file    ROS  PHYSICAL EXAMINATION:    BP 124/66   Pulse 88   Ht 5\' 10"  (1.778 m)   Wt 154 lb (69.9 kg)   LMP 06/05/2020   SpO2 100%   BMI 22.10 kg/m     General appearance: alert, cooperative and appears stated age   1. Menopause syndrome Doing great on HRT  2. Hormone replacement therapy (HRT) Doing really well, wants to continued. Again we discussed the option of low dose ocp's vs hrt. Ultimately she will have to come off of ocp's. Will continue with current regimen - estradiol (CLIMARA - DOSED IN MG/24 HR) 0.05 mg/24hr patch; Place 1 patch (0.05 mg total) onto the skin once a week.  Dispense: 12 patch; Refill: 1 - progesterone (PROMETRIUM) 100 MG capsule; Take 1 capsule (100 mg total) by mouth at bedtime.  Dispense: 90 capsule; Refill: 1  3. Low libido Better on testosterone Will f/u next week for blood work. If normal will call in a 6 month supply

## 2020-10-22 ENCOUNTER — Other Ambulatory Visit: Payer: 59

## 2020-10-22 ENCOUNTER — Other Ambulatory Visit: Payer: Self-pay

## 2020-10-22 DIAGNOSIS — R6882 Decreased libido: Secondary | ICD-10-CM

## 2020-10-27 LAB — TESTOS,TOTAL,FREE AND SHBG (FEMALE)
Free Testosterone: 1.9 pg/mL (ref 0.1–6.4)
Sex Hormone Binding: 63 nmol/L (ref 17–124)
Testosterone, Total, LC-MS-MS: 22 ng/dL (ref 2–45)

## 2020-11-02 ENCOUNTER — Other Ambulatory Visit: Payer: Self-pay | Admitting: Obstetrics and Gynecology

## 2020-11-02 DIAGNOSIS — Z79890 Hormone replacement therapy (postmenopausal): Secondary | ICD-10-CM

## 2020-11-06 ENCOUNTER — Other Ambulatory Visit: Payer: Self-pay | Admitting: Obstetrics and Gynecology

## 2020-11-06 DIAGNOSIS — R6882 Decreased libido: Secondary | ICD-10-CM

## 2020-11-10 ENCOUNTER — Other Ambulatory Visit: Payer: Self-pay

## 2020-11-10 ENCOUNTER — Telehealth: Payer: Self-pay

## 2020-11-10 MED ORDER — NONFORMULARY OR COMPOUNDED ITEM: 0 refills | Status: DC

## 2020-11-10 MED ORDER — NONFORMULARY OR COMPOUNDED ITEM: 1 refills | Status: DC

## 2020-11-10 NOTE — Telephone Encounter (Signed)
Romualdo Bolk, MD  Keenan Bachelor, RMA Please have the compounding pharmacy adjust her dose to 1% testosterone cream, use 0.75 mg daily, apply to the lower abdomen. Have her get repeat testosterone 1 month after increasing the dose. Future lab order has been placed she will just need a lab appointment.  Thanks,    Pharmacy called back to say that this cannot be formulated for milligrams. The previous Rx was 0.5 grams to be applied. I told them to place direction in Grams since that is how it is formulated and I would let Dr. Oscar La know.

## 2020-11-10 NOTE — Telephone Encounter (Signed)
Rx revised in the system and called in with revisions noted.

## 2020-11-10 NOTE — Telephone Encounter (Signed)
That was my error. It should be 1% testosterone cream, apply 0.75 grams daily. I see that you called in 15 grams. That won't be enough for a month, please change it to 30 grams with one refill. Thanks

## 2021-01-07 ENCOUNTER — Encounter: Payer: Self-pay | Admitting: Obstetrics and Gynecology

## 2021-01-07 ENCOUNTER — Other Ambulatory Visit: Payer: Self-pay

## 2021-01-07 DIAGNOSIS — N950 Postmenopausal bleeding: Secondary | ICD-10-CM

## 2021-01-07 NOTE — Telephone Encounter (Signed)
09/09/2020  FSH level 161.7 On HRT

## 2021-01-07 NOTE — Telephone Encounter (Signed)
Please call the patient and set her up for an ultrasound and visit with me for evaluation of her PMP bleeding

## 2021-01-19 ENCOUNTER — Ambulatory Visit (INDEPENDENT_AMBULATORY_CARE_PROVIDER_SITE_OTHER): Payer: 59 | Admitting: Obstetrics and Gynecology

## 2021-01-19 ENCOUNTER — Other Ambulatory Visit: Payer: Self-pay

## 2021-01-19 ENCOUNTER — Ambulatory Visit (INDEPENDENT_AMBULATORY_CARE_PROVIDER_SITE_OTHER): Payer: 59

## 2021-01-19 VITALS — BP 122/70 | HR 77 | Ht 70.0 in | Wt 154.0 lb

## 2021-01-19 DIAGNOSIS — Z79890 Hormone replacement therapy (postmenopausal): Secondary | ICD-10-CM

## 2021-01-19 DIAGNOSIS — N939 Abnormal uterine and vaginal bleeding, unspecified: Secondary | ICD-10-CM | POA: Diagnosis not present

## 2021-01-19 DIAGNOSIS — N950 Postmenopausal bleeding: Secondary | ICD-10-CM

## 2021-01-19 NOTE — Progress Notes (Signed)
GYNECOLOGY  VISIT   HPI: 50 y.o.   Married Black or [place] American Not Hispanic or Latino  female   [phone] with Patient's last menstrual period was 01/05/2021.   here for ultrasound consult for AUB. Her LMP was in 4/22 after stopping OCP's. She was seen in 7/22 with vasomotor symptoms and amenorrhea. Her FSH was 161. Normal TSH and prolactin. Low testosterone levels. She was started on Climara, prometrium and compounded testosterone.  She started with a "light cycle" on 01/05/21, now just spotting. No bleeding prior to this.  She c/o breast tenderness, started a week ago.  No vasomotor symptoms.   GYNECOLOGIC HISTORY: Patient's last menstrual period was 01/05/2021. Contraception:none  Menopausal hormone therapy: yes, estradiol, testosterone, progesterone.         OB History     Gravida  2   Para  2   Term  2   Preterm      AB      Living  2      SAB      IAB      Ectopic      Multiple      Live Births                 Patient Active Problem List   Diagnosis Date Noted   Family history of diabetes mellitus 12/06/2017   Tension type headache 03/20/2014   Eczema 03/20/2014   Depression with anxiety 03/20/2014   Benign hematuria 02/14/2014   Hemorrhoid     Past Medical History:  Diagnosis Date   Hemorrhoid     Past Surgical History:  Procedure Laterality Date   CESAREAN SECTION     x2    Current Outpatient Medications  Medication Sig Dispense Refill   ALPRAZolam (XANAX) 0.25 MG tablet Take by mouth.     Ascorbic Acid (VITA-C PO) Take 1 tablet by mouth daily.     estradiol (CLIMARA - DOSED IN MG/24 HR) 0.05 mg/24hr patch Place 1 patch (0.05 mg total) onto the skin once a week. 12 patch 1   MAGNESIUM PO Take 1 tablet by mouth daily.     NONFORMULARY OR COMPOUNDED ITEM Compounded testosterone cream 1% 30 gram tube S: apply 0.75 grams daily to lower abdomen. 30 gram tube with one refill per Dr. Sela Hilding 30 each 1   progesterone (PROMETRIUM) 100  MG capsule Take 1 capsule (100 mg total) by mouth at bedtime. 90 capsule 1   vitamin B-12 (CYANOCOBALAMIN) 1000 MCG tablet Take by mouth.     VITAMIN D PO Take 1 tablet by mouth daily.     Vitamin D-Vitamin K (K2 PLUS D3) [phone] MCG-UNIT TABS Take by mouth.     Zinc 10 MG LOZG Use as directed in the mouth or throat daily.     Current Facility-Administered Medications  Medication Dose Route Frequency Provider Last Rate Last Admin   nystatin cream (MYCOSTATIN)   Topical BID Huel Cote, NP         ALLERGIES: Patient has no known allergies.  Family History  Problem Relation Age of Onset   Hypertension Mother    Diabetes Mother        type 1   Cancer Paternal Aunt        COLON CANCER   Diabetes Maternal Grandmother    Cancer Paternal Grandmother        STOMACH CANCER   Heart disease Paternal Grandfather     Social History   Socioeconomic History  Marital status: Married    Spouse name: Not on file   Number of children: Not on file   Years of education: Not on file   Highest education level: Not on file  Occupational History   Not on file  Tobacco Use   Smoking status: Never   Smokeless tobacco: Never  Vaping Use   Vaping Use: Never used  Substance and Sexual Activity   Alcohol use: No    Alcohol/week: 0.0 standard drinks   Drug use: No   Sexual activity: Yes    Birth control/protection: Pill    Comment: first sexual encounter at age 85, less than five partners in a lifetime  Other Topics Concern   Not on file  Social History Narrative   Not on file   Social Determinants of Health   Financial Resource Strain: Not on file  Food Insecurity: Not on file  Transportation Needs: Not on file  Physical Activity: Not on file  Stress: Not on file  Social Connections: Not on file  Intimate Partner Violence: Not on file    Review of Systems  All other systems reviewed and are negative.  PHYSICAL EXAMINATION:    BP 122/70   Pulse 77   Ht 5\' 10"  (1.778 m)    Wt 154 lb (69.9 kg)   LMP 01/05/2021   SpO2 99%   BMI 22.10 kg/m     General appearance: alert, cooperative and appears stated age  Pelvic ultrasound  Indications: bleeding on HRT  Findings:  Uterus 8.67 x 5.86 x 4.61 cm  Endometrium 2.79 mm, trace amount of fluid in endometrial cavity (patient is bleeding)  Left ovary 3.43 x 2.39 x 1.70 cm  1.42 x 1.46 cm follicle  Right ovary 1.90 x 1.04 x 0.78 cm Atrophic  Trace free fluid  Impression:   1. Abnormal uterine bleeding She went off OCP's in 4/22, no bleeding until this month. She was starting on HRT this summer secondary to significant menopausal symptoms and an elevated FSH. Given her symptoms of breast tenderness and the follicle seen on ultrasound, I suspect she is perimenopausal not postmenopausal.  -Discussed perimenopause -Ultrasound is reassuring. -Calendar all bleeding -If she has bleeding again will consider changing her to cyclic HRT -F/U for an annual exam in 2/23  2. Hormone replacement therapy (HRT) Feeling well on HRT.   In addition to reviewing the ultrasound results, ~ 10 minutes was spent in obtaining further history, discussing perimenopause and making a plan.

## 2021-01-21 ENCOUNTER — Encounter: Payer: Self-pay | Admitting: Obstetrics and Gynecology

## 2021-02-08 ENCOUNTER — Encounter: Payer: Self-pay | Admitting: Obstetrics and Gynecology

## 2021-02-08 NOTE — Telephone Encounter (Signed)
Per note on 01/19/21 "If she has bleeding again will consider changing her to cyclic HRT"

## 2021-02-08 NOTE — Telephone Encounter (Signed)
Please let the patient know that I would recommend changing her to cyclic HRT for now.  She continues with her current estrogen dose.  Instead of taking 100 mg of the progesterone every evening, she will take 2 tablets for 12 days a month. Nothing the other nights.  I would have her start the 2 times a night dosing ~2 weeks after the start of her current bleeding (ie if she started on 12/1, she would start the progesterone on 12/14). She should then continue this cycle monthly (ie if she starts the progesterone on 12/14, then she should start it again on 1/14).  She should calendar when she is taking the progesterone and when she is bleeding.

## 2021-04-12 ENCOUNTER — Other Ambulatory Visit: Payer: Self-pay | Admitting: Obstetrics and Gynecology

## 2021-04-12 DIAGNOSIS — Z79890 Hormone replacement therapy (postmenopausal): Secondary | ICD-10-CM

## 2021-04-12 NOTE — Telephone Encounter (Signed)
Annual exam scheduled on 06/28/21 Last annual exam 04/13/20 Last mammogram scanned in from [facility] 2018

## 2021-04-13 MED ORDER — PROGESTERONE MICRONIZED 100 MG PO CAPS: 100.0000 mg | ORAL_CAPSULE | Freq: Every day | ORAL | 0 refills | Status: DC

## 2021-05-25 ENCOUNTER — Other Ambulatory Visit: Payer: Self-pay

## 2021-05-25 ENCOUNTER — Ambulatory Visit: Payer: 59 | Admitting: Obstetrics and Gynecology

## 2021-05-25 ENCOUNTER — Encounter: Payer: Self-pay | Admitting: Obstetrics and Gynecology

## 2021-05-25 VITALS — BP 122/64 | HR 77 | Ht 70.0 in | Wt 164.0 lb

## 2021-05-25 DIAGNOSIS — B9689 Other specified bacterial agents as the cause of diseases classified elsewhere: Secondary | ICD-10-CM | POA: Diagnosis not present

## 2021-05-25 DIAGNOSIS — N760 Acute vaginitis: Secondary | ICD-10-CM

## 2021-05-25 DIAGNOSIS — B3731 Acute candidiasis of vulva and vagina: Secondary | ICD-10-CM

## 2021-05-25 LAB — WET PREP FOR TRICH, YEAST, CLUE

## 2021-05-25 MED ORDER — FLUCONAZOLE 150 MG PO TABS: 150.0000 mg | ORAL_TABLET | Freq: Once | ORAL | 0 refills | Status: AC

## 2021-05-25 MED ORDER — METRONIDAZOLE 500 MG PO TABS: 500.0000 mg | ORAL_TABLET | Freq: Two times a day (BID) | ORAL | 0 refills | Status: DC

## 2021-05-25 NOTE — Patient Instructions (Signed)
Bacterial Vaginosis ?Bacterial vaginosis is an infection that occurs when the normal balance of bacteria in the vagina changes. This change is caused by an overgrowth of certain bacteria in the vagina. Bacterial vaginosis is the most common vaginal infection among females aged 49 to 4949 years. ?This condition increases the risk of sexually transmitted infections (STIs). Treatment can help reduce this risk. Treatment is very important for pregnant women because this condition can cause babies to be born early (prematurely) or at a low birth weight. ?What are the causes? ?This condition is caused by an increase in harmful bacteria that are normally present in small amounts in the vagina. However, the exact reason this condition develops is not known. ?You cannot get bacterial vaginosis from toilet seats, bedding, swimming pools, or contact with objects around you. ?What increases the risk? ?The following factors may make you more likely to develop this condition: ?Having a new sexual partner or multiple sexual partners, or having unprotected sex. ?Douching. ?Having an intrauterine device (IUD). ?Smoking. ?Abusing drugs and alcohol. This may lead to riskier sexual behavior. ?Taking certain antibiotic medicines. ?Being pregnant. ?What are the signs or symptoms? ?Some women with this condition have no symptoms. Symptoms may include: ?Wallace CullensGray or white vaginal discharge. The discharge can be watery or foamy. ?A fish-like odor with discharge, especially after sex or during menstruation. ?Itching in and around the vagina. ?Burning or pain with urination. ?How is this diagnosed? ?This condition is diagnosed based on: ?Your medical history. ?A physical exam of the vagina. ?Checking a sample of vaginal fluid for harmful bacteria or abnormal cells. ?How is this treated? ?This condition is treated with antibiotic medicines. These may be given as a pill, a vaginal cream, or a medicine that is put into the vagina (suppository). If the  condition comes back after treatment, a second round of antibiotics may be needed. ?Follow these instructions at home: ?Medicines ?Take or apply over-the-counter and prescription medicines only as told by your health care provider. ?Take or apply your antibiotic medicine as told by your health care provider. Do not stop using the antibiotic even if you start to feel better. ?General instructions ?If you have a female sexual partner, tell her that you have a vaginal infection. She should follow up with her health care provider. If you have a female sexual partner, he does not need treatment. ?Avoid sexual activity until you finish treatment. ?Drink enough fluid to keep your urine pale yellow. ?Keep the area around your vagina and rectum clean. ?Wash the area daily with warm water. ?Wipe yourself from front to back after using the toilet. ?If you are breastfeeding, talk to your health care provider about continuing breastfeeding during treatment. ?Keep all follow-up visits. This is important. ?How is this prevented? ?Self-care ?Do not douche. ?Wash the outside of your vagina with warm water only. ?Wear cotton or cotton-lined underwear. ?Avoid wearing tight pants and pantyhose, especially during the summer. ?Safe sex ?Use protection when having sex. This includes: ?Using condoms. ?Using dental dams. This is a thin layer of a material made of latex or polyurethane that protects the mouth during oral sex. ?Limit the number of sexual partners. To help prevent bacterial vaginosis, it is best to have sex with just one partner (monogamous relationship). ?Make sure you and your sexual partner are tested for STIs. ?Drugs and alcohol ?Do not use any products that contain nicotine or tobacco. These products include cigarettes, chewing tobacco, and vaping devices, such as e-cigarettes. If you need help quitting,  ask your health care provider. ?Do not use drugs. ?Do not drink alcohol if: ?Your health care provider tells you not to  do this. ?You are pregnant, may be pregnant, or are planning to become pregnant. ?If you drink alcohol: ?Limit how much you have to 0-1 drink a day. ?Be aware of how much alcohol is in your drink. In the [place] one drink equals one 12 oz bottle of beer (355 mL), one 5 oz glass of wine (148 mL), or one 1? oz glass of hard liquor (44 mL). ?Where to find more information ?Centers for Disease Control and Prevention: FootballExhibition.com.br ?American Sexual Health Association (ASHA): www.ashastd.org ?[place] Department of Health and [employer], Office on Women's Health: http://hoffman.com/ ?Contact a health care provider if: ?Your symptoms do not improve, even after treatment. ?You have more discharge or pain when urinating. ?You have a fever or chills. ?You have pain in your abdomen or pelvis. ?You have pain during sex. ?You have vaginal bleeding between menstrual periods. ?Summary ?Bacterial vaginosis is a vaginal infection that occurs when the normal balance of bacteria in the vagina changes. It results from an overgrowth of certain bacteria. ?This condition increases the risk of sexually transmitted infections (STIs). Getting treated can help reduce this risk. ?Treatment is very important for pregnant women because this condition can cause babies to be born early (prematurely) or at low birth weight. ?This condition is treated with antibiotic medicines. These may be given as a pill, a vaginal cream, or a medicine that is put into the vagina (suppository). ?This information is not intended to replace advice given to you by your health care provider. Make sure you discuss any questions you have with your health care provider. ?Document Revised: 08/22/2019 Document Reviewed: 08/22/2019 ?Elsevier Patient Education ? 2022 Elsevier Inc. ?Vaginal Yeast Infection, Adult ?Vaginal yeast infection is a condition that causes vaginal discharge as well as soreness, swelling, and redness (inflammation) of the vagina. This is a common  condition. Some women get this infection frequently. ?What are the causes? ?This condition is caused by a change in the normal balance of the yeast (Candida) and normal bacteria that live in the vagina. This change causes an overgrowth of yeast, which causes the inflammation. ?What increases the risk? ?The condition is more likely to develop in women who: ?Take antibiotic medicines. ?Have diabetes. ?Take birth control pills. ?Are pregnant. ?Douche often. ?Have a weak body defense system (immune system). ?Have been taking steroid medicines for a long time. ?Frequently wear tight clothing. ?What are the signs or symptoms? ?Symptoms of this condition include: ?White, thick, creamy vaginal discharge. ?Swelling, itching, redness, and irritation of the vagina. The lips of the vagina (labia) may be affected as well. ?Pain or a burning feeling while urinating. ?Pain during sex. ?How is this diagnosed? ?This condition is diagnosed based on: ?Your medical history. ?A physical exam. ?A pelvic exam. Your health care provider will examine a sample of your vaginal discharge under a microscope. Your health care provider may send this sample for testing to confirm the diagnosis. ?How is this treated? ?This condition is treated with medicine. Medicines may be over-the-counter or prescription. You may be told to use one or more of the following: ?Medicine that is taken by mouth (orally). ?Medicine that is applied as a cream (topically). ?Medicine that is inserted directly into the vagina (suppository). ?Follow these instructions at home: ?Take or apply over-the-counter and prescription medicines only as told by your health care provider. ?Do not use tampons until  your health care provider approves. ?Do not have sex until your infection has cleared. Sex can prolong or worsen your symptoms of infection. Ask your health care provider when it is safe to resume sexual activity. ?Keep all follow-up visits. This is important. ?How is this  prevented? ? ?Do not wear tight clothes, such as pantyhose or tight pants. ?Wear breathable cotton underwear. ?Do not use douches, perfumed soap, creams, or powders. ?Wipe from front to back after using the [facility]

## 2021-05-25 NOTE — Progress Notes (Signed)
GYNECOLOGY  VISIT ?  ?HPI: ?51 y.o.   Married Black or [place] American Not Hispanic or Latino  female   ?[id number] with Patient's last menstrual period was 04/28/2021.   ?here for  Vaginal discharge and odor.  Symptoms started ~2 months ago, very mild. The discharge is clear to creamy, small amount. No itching, burning or irritation.  ? ?GYNECOLOGIC HISTORY: ?Patient's last menstrual period was 04/28/2021. ?Contraception:none  ?Menopausal hormone therapy: estradiol progesterone.  ?       ?OB History   ? ? Gravida  ?2  ? Para  ?2  ? Term  ?2  ? Preterm  ?   ? AB  ?   ? Living  ?2  ?  ? ? SAB  ?   ? IAB  ?   ? Ectopic  ?   ? Multiple  ?   ? Live Births  ?   ?   ?  ?  ?    ? ?Patient Active Problem List  ? Diagnosis Date Noted  ? Family history of diabetes mellitus 12/06/2017  ? Tension type headache 03/20/2014  ? Eczema 03/20/2014  ? Depression with anxiety 03/20/2014  ? Benign hematuria 02/14/2014  ? Hemorrhoid   ? ? ?Past Medical History:  ?Diagnosis Date  ? Hemorrhoid   ? ? ?Past Surgical History:  ?Procedure Laterality Date  ? CESAREAN SECTION    ? x2  ? ? ?Current Outpatient Medications  ?Medication Sig Dispense Refill  ? ALPRAZolam (XANAX) 0.25 MG tablet Take by mouth.    ? Ascorbic Acid (VITA-C PO) Take 1 tablet by mouth daily.    ? estradiol (CLIMARA - DOSED IN MG/24 HR) 0.05 mg/24hr patch PLACE 1 PATCH ONTO THE SKIN ONCE A WEEK. 12 patch 0  ? MAGNESIUM PO Take 1 tablet by mouth daily.    ? progesterone (PROMETRIUM) 100 MG capsule Take 1 capsule (100 mg total) by mouth at bedtime. 90 capsule 0  ? vitamin B-12 (CYANOCOBALAMIN) 1000 MCG tablet Take by mouth.    ? VITAMIN D PO Take 1 tablet by mouth daily.    ? Vitamin D-Vitamin K (K2 PLUS D3) [id number] MCG-UNIT TABS Take by mouth.    ? Zinc 10 MG LOZG Use as directed in the mouth or throat daily.    ? ?No current facility-administered medications for this visit.  ?  ? ?ALLERGIES: Patient has no known allergies. ? ?Family History  ?Problem Relation Age of Onset  ?  Hypertension Mother   ? Diabetes Mother   ?     type 1  ? Cancer Paternal Aunt   ?     COLON CANCER  ? Diabetes Maternal Grandmother   ? Cancer Paternal Grandmother   ?     STOMACH CANCER  ? Heart disease Paternal Grandfather   ? ? ?Social History  ? ?Socioeconomic History  ? Marital status: Married  ?  Spouse name: Not on file  ? Number of children: Not on file  ? Years of education: Not on file  ? Highest education level: Not on file  ?Occupational History  ? Not on file  ?Tobacco Use  ? Smoking status: Never  ? Smokeless tobacco: Never  ?Vaping Use  ? Vaping Use: Never used  ?Substance and Sexual Activity  ? Alcohol use: No  ?  Alcohol/week: 0.0 standard drinks  ? Drug use: No  ? Sexual activity: Yes  ?  Birth control/protection: Pill  ?  Comment: first sexual encounter at  age 47, less than five partners in a lifetime  ?Other Topics Concern  ? Not on file  ?Social History Narrative  ? Not on file  ? ?Social Determinants of Health  ? ?Financial Resource Strain: Not on file  ?Food Insecurity: Not on file  ?Transportation Needs: Not on file  ?Physical Activity: Not on file  ?Stress: Not on file  ?Social Connections: Not on file  ?Intimate Partner Violence: Not on file  ? ? ?Review of Systems  ?All other systems reviewed and are negative. ? ?PHYSICAL EXAMINATION:   ? ?BP 122/64   Pulse 77   Ht 5\' 10"  (1.778 m)   Wt 164 lb (74.4 kg)   LMP 04/28/2021   SpO2 100%   BMI 23.53 kg/m?     ?General appearance: alert, cooperative and appears stated age ? ? ?Pelvic: External genitalia:  no lesions, mild agglutination of labia minora to majora, no erythema, no whitening, no fissures ?             Urethra:  normal appearing urethra with no masses, tenderness or lesions ?             Bartholins and Skenes: normal    ?             Vagina: normal appearing vagina with an increase in thick, clumpy, white  vaginal discharge ?             Cervix: no lesions ?              ?Chaperone was present for exam. ? ?1. Acute  vaginitis ?Exam c/w yeast ?- WET PREP FOR TRICH, YEAST, CLUE ? ?2. Yeast vaginitis ?- fluconazole (DIFLUCAN) 150 MG tablet; Take 1 tablet (150 mg total) by mouth once for 1 dose. Take one tablet.  Repeat in 72 hours if symptoms are not completely resolved.  Dispense: 2 tablet; Refill: 0 ? ?3. BV (bacterial vaginosis) ?- metroNIDAZOLE (FLAGYL) 500 MG tablet; Take 1 tablet (500 mg total) by mouth 2 (two) times daily.  Dispense: 14 tablet; Refill: 0 (no ETOH) ? ?

## 2021-05-26 ENCOUNTER — Ambulatory Visit: Payer: 59 | Admitting: Obstetrics and Gynecology

## 2021-06-22 NOTE — Progress Notes (Signed)
51 y.o. [id number] Married Black or [place] American Not Hispanic or Latino female here for annual exam.  She was started on HRT last summer for vasomotor symptoms. She went off of OCP's in 4/22. She had some light bleeding in 11/22 associated with breast tenderness. U/S with a thin endometrial stripe and a left ovarian follicle.  ?She had some spotting, so she started the cyclic progesterone. She forgot to take the 2 Prometrium and was taking one. She is bleeding after she finishes the progesterone for 10-12 days.  ?Period Pattern: (!) Irregular ?Menstrual Flow: Moderate ?Menstrual Control: Maxi pad, Tampon ?Dysmenorrhea: (!) Mild ?Dysmenorrhea Symptoms: Cramping ?Sexually active, no pain. ? ?No bowel or bladder c/o.  ? ?Patient's last menstrual period was 06/03/2021.          ?Sexually active: Yes.    ?The current method of family planning is none.    ?Exercising: Yes.    Gym/ health club routine includes cardio and light weights. ?Smoker:  no ? ?Health Maintenance: ?Pap:  04-04-2018 ascus HPV HR neg ?History of abnormal Pap:  no ?MMG:  06/19/20 Bi-rads 2 benign ( care everywhere)  ?BMD:   none  ?Colonoscopy: 12/11/20  (care everywhere), 3 polyps. F/U in 3 years ?TDaP:  10/13/15  ?Gardasil: none  ? ? reports that she has never smoked. She has never been exposed to tobacco smoke. She has never used smokeless tobacco. She reports that she does not drink alcohol and does not use drugs. Real Psychologist, occupational. Sons are 19 and 15. Oldest is a Printmaker at [employer], scholarship through [employer].  Corliss Parish is a sophomore in HS.  ? ?Past Medical History:  ?Diagnosis Date  ? Hemorrhoid   ? ? ?Past Surgical History:  ?Procedure Laterality Date  ? CESAREAN SECTION    ? x2  ? ? ?Current Outpatient Medications  ?Medication Sig Dispense Refill  ? ALPRAZolam (XANAX) 0.25 MG tablet Take by mouth.    ? Ascorbic Acid (VITA-C PO) Take 1 tablet by mouth daily.    ? estradiol (CLIMARA - DOSED IN MG/24 HR) 0.05 mg/24hr patch PLACE 1 PATCH ONTO  THE SKIN ONCE A WEEK. 12 patch 0  ? MAGNESIUM PO Take 1 tablet by mouth daily.    ? progesterone (PROMETRIUM) 100 MG capsule Take 1 capsule (100 mg total) by mouth at bedtime. 90 capsule 0  ? vitamin B-12 (CYANOCOBALAMIN) 1000 MCG tablet Take by mouth.    ? VITAMIN D PO Take 1 tablet by mouth daily.    ? Vitamin D-Vitamin K (K2 PLUS D3) [phone] MCG-UNIT TABS Take by mouth.    ? Zinc 10 MG LOZG Use as directed in the mouth or throat daily.    ? ?No current facility-administered medications for this visit.  ? ? ?Family History  ?Problem Relation Age of Onset  ? Hypertension Mother   ? Diabetes Mother   ?     type 1  ? Cancer Paternal Aunt   ?     COLON CANCER  ? Diabetes Maternal Grandmother   ? Cancer Paternal Grandmother   ?     STOMACH CANCER  ? Heart disease Paternal Grandfather   ? ? ?Review of Systems ? ?Exam:   ?BP 110/78 (BP Location: Right Arm, Patient Position: Sitting, Cuff Size: Normal)   Ht 5\' 10"  (1.778 m)   Wt 161 lb (73 kg)   LMP 06/03/2021   BMI 23.10 kg/m?   Weight change: @WEIGHTCHANGE @ Height:   Height: 5\' 10"  (177.8 cm)  ?  Ht Readings from Last 3 Encounters:  ?06/28/21 5\' 10"  (1.778 m)  ?05/25/21 5\' 10"  (1.778 m)  ?01/19/21 5\' 10"  (1.778 m)  ? ? ?General appearance: alert, cooperative and appears stated age ?Head: Normocephalic, without obvious abnormality, atraumatic ?Neck: no adenopathy, supple, symmetrical, trachea midline and thyroid normal to inspection and palpation ?Lungs: clear to auscultation bilaterally ?Cardiovascular: regular rate and rhythm ?Breasts: normal appearance, no masses or tenderness ?Abdomen: soft, non-tender; non distended,  no masses,  no organomegaly ?Extremities: extremities normal, atraumatic, no cyanosis or edema ?Skin: Skin color, texture, turgor normal. No rashes or lesions ?Lymph nodes: Cervical, supraclavicular, and axillary nodes normal. ?No abnormal inguinal nodes palpated ?Neurologic: Grossly normal ? ? ?Pelvic: External genitalia:  no lesions ?              Urethra:  normal appearing urethra with no masses, tenderness or lesions ?             Bartholins and Skenes: normal    ?             Vagina: normal appearing vagina with normal color and discharge, no lesions ?             Cervix: no lesions ?              ?Bimanual Exam:  Uterus:  normal size, contour, position, consistency, mobility, non-tender ?             Adnexa: no mass, fullness, tenderness ?              Rectovaginal: Confirms ?              Anus:  normal sphincter tone, no lesions ? ? , RMA chaperoned for the exam. ? ?1. Well woman exam ?Discussed breast self exam ?Discussed calcium and vit D intake ?Mammogram due, she will schedule ?Colonoscopy UTD ?Screening labs UTD ? ?2. Hormone replacement therapy (HRT) ?She has transitioned from daily to cyclic HRT, hasn't been taking it consistently. Will work on it.  ?- estradiol (CLIMARA - DOSED IN MG/24 HR) 0.05 mg/24hr patch; PLACE 1 PATCH ONTO THE SKIN ONCE A WEEK.  Dispense: 12 patch; Refill: 3 ?- progesterone (PROMETRIUM) 200 MG capsule; Take one tablet a day for 12 days every month, start on the 15th of the month.  Dispense: 26 capsule; Refill: 3 ?-She will calendar her bleeding, if bleeding for >8 days, if her bleeding is irregular or inconsistent she will reach out. ? ?3. Screening for cervical cancer ?- Cytology - PAP ? ?4. Family history of diabetes mellitus ?- Hemoglobin A1c ? ? ?

## 2021-06-28 ENCOUNTER — Other Ambulatory Visit (HOSPITAL_COMMUNITY)
Admission: RE | Admit: 2021-06-28 | Discharge: 2021-06-28 | Disposition: A | Discharge status: Home / Self Care | Payer: 59 | Source: Ambulatory Visit | Attending: Obstetrics and Gynecology | Admitting: Obstetrics and Gynecology

## 2021-06-28 ENCOUNTER — Encounter: Payer: Self-pay | Admitting: Obstetrics and Gynecology

## 2021-06-28 ENCOUNTER — Ambulatory Visit (INDEPENDENT_AMBULATORY_CARE_PROVIDER_SITE_OTHER): Payer: 59 | Admitting: Obstetrics and Gynecology

## 2021-06-28 VITALS — BP 110/78 | Ht 70.0 in | Wt 161.0 lb

## 2021-06-28 DIAGNOSIS — Z124 Encounter for screening for malignant neoplasm of cervix: Secondary | ICD-10-CM | POA: Insufficient documentation

## 2021-06-28 DIAGNOSIS — Z01419 Encounter for gynecological examination (general) (routine) without abnormal findings: Secondary | ICD-10-CM

## 2021-06-28 DIAGNOSIS — Z79890 Hormone replacement therapy (postmenopausal): Secondary | ICD-10-CM | POA: Diagnosis not present

## 2021-06-28 DIAGNOSIS — Z833 Family history of diabetes mellitus: Secondary | ICD-10-CM | POA: Diagnosis not present

## 2021-06-28 MED ORDER — PROGESTERONE 200 MG PO CAPS: ORAL_CAPSULE | ORAL | 3 refills | Status: DC

## 2021-06-28 MED ORDER — ESTRADIOL 0.05 MG/24HR TD PTWK: MEDICATED_PATCH | TRANSDERMAL | 3 refills | Status: DC

## 2021-06-28 NOTE — Patient Instructions (Signed)

## 2021-06-29 LAB — HEMOGLOBIN A1C
Hgb A1c MFr Bld: 5.2 % of total Hgb (ref <5.7)
Mean Plasma Glucose: 103 mg/dL
eAG (mmol/L): 5.7 mmol/L

## 2021-06-30 LAB — CYTOLOGY - PAP
Adequacy: ABSENT
Comment: NEGATIVE
Diagnosis: UNDETERMINED — AB
High risk HPV: NEGATIVE

## 2022-01-04 ENCOUNTER — Encounter: Payer: Self-pay | Admitting: Obstetrics and Gynecology

## 2022-01-04 DIAGNOSIS — Z79890 Hormone replacement therapy (postmenopausal): Secondary | ICD-10-CM

## 2022-01-04 NOTE — Telephone Encounter (Signed)
She is correct, she should have prometrium 100 mg, 1 po q d for 12 days a month. Should be #36. Please call in 36 with one refill.  She is currently on climara and the pharmacy can't get it. Can you check with the pharmacy as to which patch they have and call in an equivalent patch for her. 3 month supply with one refill.

## 2022-01-05 MED ORDER — ESTRADIOL 0.05 MG/24HR TD PTTW: 1.0000 | MEDICATED_PATCH | TRANSDERMAL | 1 refills | Status: DC

## 2022-01-05 NOTE — Telephone Encounter (Signed)
The correct dose is 200 mg, thanks for catching that.

## 2022-01-05 NOTE — Telephone Encounter (Signed)
Dr. Talbert Nan they have estradiol (vivelle-dot) 0.05 in stock I will send in.   Also you mentioned "prometrium 100 mg, 1 po q d for 12 days a month" patient previous Rx was for progesterone 200 mg day 1-12. I just want to confirm she will be changing from 200 mg to 100 mg. Please advise

## 2022-01-06 MED ORDER — PROGESTERONE 200 MG PO CAPS: ORAL_CAPSULE | ORAL | 1 refills | Status: DC

## 2022-02-07 ENCOUNTER — Other Ambulatory Visit: Payer: Self-pay

## 2022-02-07 DIAGNOSIS — Z79890 Hormone replacement therapy (postmenopausal): Secondary | ICD-10-CM

## 2022-02-07 MED ORDER — PROGESTERONE 200 MG PO CAPS: ORAL_CAPSULE | ORAL | 1 refills | Status: DC

## 2022-05-13 ENCOUNTER — Other Ambulatory Visit: Payer: Self-pay | Admitting: Obstetrics and Gynecology

## 2022-05-13 DIAGNOSIS — Z79890 Hormone replacement therapy (postmenopausal): Secondary | ICD-10-CM

## 2022-05-16 NOTE — Telephone Encounter (Signed)
Last AEX 06/28/2021--recall sent per EMR. Nothing currently scheduled. Last mammo report seen 06/19/2020-birads 2 benign  Last refill per pharmacy of #24 patches on 03/25/2022. Pt should have enough to last until ~06/17/2022.   Rx pend. Please advise.

## 2022-05-17 NOTE — Telephone Encounter (Signed)
Please check with the patient if she has had a mammogram since 4/22, if so we need a copy. Please schedule her for an annual exam in 4/24. We can send in another 8 patches to get her to her appointment to her local pharmacy.

## 2022-05-19 NOTE — Telephone Encounter (Signed)
Spoke with patient. She acknowleged that she did not have mammogram last year  She promises to call and schedule it. She has them with [facility].  She is due after 06/29/22 for her AEX and messag sent to appt desk to schedule her appt.

## 2022-06-29 ENCOUNTER — Other Ambulatory Visit: Payer: Self-pay | Admitting: Obstetrics and Gynecology

## 2022-06-29 DIAGNOSIS — Z79890 Hormone replacement therapy (postmenopausal): Secondary | ICD-10-CM

## 2022-07-21 ENCOUNTER — Other Ambulatory Visit (HOSPITAL_COMMUNITY)
Admission: RE | Admit: 2022-07-21 | Discharge: 2022-07-21 | Disposition: A | Discharge status: Home / Self Care | Payer: BC Managed Care – PPO | Source: Ambulatory Visit | Attending: Obstetrics and Gynecology | Admitting: Obstetrics and Gynecology

## 2022-07-21 ENCOUNTER — Ambulatory Visit: Payer: BC Managed Care – PPO | Admitting: Obstetrics and Gynecology

## 2022-07-21 ENCOUNTER — Encounter: Payer: Self-pay | Admitting: Obstetrics and Gynecology

## 2022-07-21 VITALS — BP 112/80 | HR 83 | Ht 72.0 in | Wt 157.0 lb

## 2022-07-21 DIAGNOSIS — Z01419 Encounter for gynecological examination (general) (routine) without abnormal findings: Secondary | ICD-10-CM | POA: Diagnosis not present

## 2022-07-21 DIAGNOSIS — B354 Tinea corporis: Secondary | ICD-10-CM

## 2022-07-21 DIAGNOSIS — B3731 Acute candidiasis of vulva and vagina: Secondary | ICD-10-CM | POA: Diagnosis not present

## 2022-07-21 DIAGNOSIS — B9689 Other specified bacterial agents as the cause of diseases classified elsewhere: Secondary | ICD-10-CM

## 2022-07-21 DIAGNOSIS — N898 Other specified noninflammatory disorders of vagina: Secondary | ICD-10-CM

## 2022-07-21 DIAGNOSIS — Z124 Encounter for screening for malignant neoplasm of cervix: Secondary | ICD-10-CM

## 2022-07-21 DIAGNOSIS — Z79890 Hormone replacement therapy (postmenopausal): Secondary | ICD-10-CM | POA: Diagnosis not present

## 2022-07-21 DIAGNOSIS — N760 Acute vaginitis: Secondary | ICD-10-CM | POA: Diagnosis not present

## 2022-07-21 LAB — WET PREP FOR TRICH, YEAST, CLUE

## 2022-07-21 MED ORDER — METRONIDAZOLE 500 MG PO TABS
500.0000 mg | ORAL_TABLET | Freq: Two times a day (BID) | ORAL | 0 refills | Status: DC
Start: 2022-07-21 — End: 2023-08-07

## 2022-07-21 MED ORDER — ESTRADIOL 0.05 MG/24HR TD PTTW: MEDICATED_PATCH | TRANSDERMAL | 3 refills | Status: DC

## 2022-07-21 MED ORDER — PROGESTERONE 200 MG PO CAPS
ORAL_CAPSULE | ORAL | 3 refills | Status: DC
Start: 2022-07-21 — End: 2023-08-07

## 2022-07-21 MED ORDER — FLUCONAZOLE 150 MG PO TABS
150.0000 mg | ORAL_TABLET | Freq: Once | ORAL | 0 refills | Status: AC
Start: 2022-07-21 — End: 2022-07-21

## 2022-07-21 NOTE — Patient Instructions (Addendum)
Buy Lotrimin cream for the rash on your chest wall  Bacterial Vaginosis  Bacterial vaginosis is an infection that occurs when the normal balance of bacteria in the vagina changes. This change is caused by an overgrowth of certain bacteria in the vagina. Bacterial vaginosis is the most common vaginal infection among females aged 50 to 47 years. This condition increases the risk of sexually transmitted infections (STIs). Treatment can help reduce this risk. Treatment is very important for pregnant women because this condition can cause babies to be born early (prematurely) or at a low birth weight. What are the causes? This condition is caused by an increase in harmful bacteria that are normally present in small amounts in the vagina. However, the exact reason this condition develops is not known. You cannot get bacterial vaginosis from toilet seats, bedding, swimming pools, or contact with objects around you. What increases the risk? The following factors may make you more likely to develop this condition: Having a new sexual partner or multiple sexual partners, or having unprotected sex. Douching. Having an intrauterine device (IUD). Smoking. Abusing drugs and alcohol. This may lead to riskier sexual behavior. Taking certain antibiotic medicines. Being pregnant. What are the signs or symptoms? Some women with this condition have no symptoms. Symptoms may include: Wallace Cullens or white vaginal discharge. The discharge can be watery or foamy. A fish-like odor with discharge, especially after sex or during menstruation. Itching in and around the vagina. Burning or pain with urination. How is this diagnosed? This condition is diagnosed based on: Your medical history. A physical exam of the vagina. Checking a sample of vaginal fluid for harmful bacteria or abnormal cells. How is this treated? This condition is treated with antibiotic medicines. These may be given as a pill, a vaginal cream, or a  medicine that is put into the vagina (suppository). If the condition comes back after treatment, a second round of antibiotics may be needed. Follow these instructions at home: Medicines Take or apply over-the-counter and prescription medicines only as told by your health care provider. Take or apply your antibiotic medicine as told by your health care provider. Do not stop using the antibiotic even if you start to feel better. General instructions If you have a female sexual partner, tell her that you have a vaginal infection. She should follow up with her health care provider. If you have a female sexual partner, he does not need treatment. Avoid sexual activity until you finish treatment. Drink enough fluid to keep your urine pale yellow. Keep the area around your vagina and rectum clean. Wash the area daily with warm water. Wipe yourself from front to back after using the toilet. If you are breastfeeding, talk to your health care provider about continuing breastfeeding during treatment. Keep all follow-up visits. This is important. How is this prevented? Self-care Do not douche. Wash the outside of your vagina with warm water only. Wear cotton or cotton-lined underwear. Avoid wearing tight pants and pantyhose, especially during the summer. Safe sex Use protection when having sex. This includes: Using condoms. Using dental dams. This is a thin layer of a material made of latex or polyurethane that protects the mouth during oral sex. Limit the number of sexual partners. To help prevent bacterial vaginosis, it is best to have sex with just one partner (monogamous relationship). Make sure you and your sexual partner are tested for STIs. Drugs and alcohol Do not use any products that contain nicotine or tobacco. These products include cigarettes, chewing  tobacco, and vaping devices, such as e-cigarettes. If you need help quitting, ask your health care provider. Do not use drugs. Do not  drink alcohol if: Your health care provider tells you not to do this. You are pregnant, may be pregnant, or are planning to become pregnant. If you drink alcohol: Limit how much you have to 0-1 drink a day. Be aware of how much alcohol is in your drink. In the [place] one drink equals one 12 oz bottle of beer (355 mL), one 5 oz glass of wine (148 mL), or one 1 oz glass of hard liquor (44 mL). Where to find more information Centers for Disease Control and Prevention: FootballExhibition.com.br American Sexual Health Association (ASHA): www.ashastd.org U.S. Department of Health and Health and safety inspector, Office on Women's Health: http://hoffman.com/ Contact a health care provider if: Your symptoms do not improve, even after treatment. You have more discharge or pain when urinating. You have a fever or chills. You have pain in your abdomen or pelvis. You have pain during sex. You have vaginal bleeding between menstrual periods. Summary Bacterial vaginosis is a vaginal infection that occurs when the normal balance of bacteria in the vagina changes. It results from an overgrowth of certain bacteria. This condition increases the risk of sexually transmitted infections (STIs). Getting treated can help reduce this risk. Treatment is very important for pregnant women because this condition can cause babies to be born early (prematurely) or at low birth weight. This condition is treated with antibiotic medicines. These may be given as a pill, a vaginal cream, or a medicine that is put into the vagina (suppository). This information is not intended to replace advice given to you by your health care provider. Make sure you discuss any questions you have with your health care provider. Document Revised: 08/22/2019 Document Reviewed: 08/22/2019 Elsevier Patient Education  2023 Elsevier Inc. Vaginal Yeast Infection, Adult  Vaginal yeast infection is a condition that causes vaginal discharge as well as soreness, swelling, and  redness (inflammation) of the vagina. This is a common condition. Some women get this infection frequently. What are the causes? This condition is caused by a change in the normal balance of the yeast (Candida) and normal bacteria that live in the vagina. This change causes an overgrowth of yeast, which causes the inflammation. What increases the risk? The condition is more likely to develop in women who: Take antibiotic medicines. Have diabetes. Take birth control pills. Are pregnant. Douche often. Have a weak body defense system (immune system). Have been taking steroid medicines for a long time. Frequently wear tight clothing. What are the signs or symptoms? Symptoms of this condition include: White, thick, creamy vaginal discharge. Swelling, itching, redness, and irritation of the vagina. The lips of the vagina (labia) may be affected as well. Pain or a burning feeling while urinating. Pain during sex. How is this diagnosed? This condition is diagnosed based on: Your medical history. A physical exam. A pelvic exam. Your health care provider will examine a sample of your vaginal discharge under a microscope. Your health care provider may send this sample for testing to confirm the diagnosis. How is this treated? This condition is treated with medicine. Medicines may be over-the-counter or prescription. You may be told to use one or more of the following: Medicine that is taken by mouth (orally). Medicine that is applied as a cream (topically). Medicine that is inserted directly into the vagina (suppository). Follow these instructions at home: Take or apply over-the-counter and prescription medicines  only as told by your health care provider. Do not use tampons until your health care provider approves. Do not have sex until your infection has cleared. Sex can prolong or worsen your symptoms of infection. Ask your health care provider when it is safe to resume sexual activity. Keep  all follow-up visits. This is important. How is this prevented?  Do not wear tight clothes, such as pantyhose or tight pants. Wear breathable cotton underwear. Do not use douches, perfumed soap, creams, or powders. Wipe from front to back after using the toilet. If you have diabetes, keep your blood sugar levels under control. Ask your health care provider for other ways to prevent yeast infections. Contact a health care provider if: You have a fever. Your symptoms go away and then return. Your symptoms do not get better with treatment. Your symptoms get worse. You have new symptoms. You develop blisters in or around your vagina. You have blood coming from your vagina and it is not your menstrual period. You develop pain in your abdomen. Summary Vaginal yeast infection is a condition that causes discharge as well as soreness, swelling, and redness (inflammation) of the vagina. This condition is treated with medicine. Medicines may be over-the-counter or prescription. Take or apply over-the-counter and prescription medicines only as told by your health care provider. Do not douche. Resume sexual activity or use of tampons as instructed by your health care provider. Contact a health care provider if your symptoms do not get better with treatment or your symptoms go away and then return. This information is not intended to replace advice given to you by your health care provider. Make sure you discuss any questions you have with your health care provider. EXERCISE   We recommended that you start or continue a regular exercise program for good health. Physical activity is anything that gets your body moving, some is better than none. The CDC recommends 150 minutes per week of Moderate-Intensity Aerobic Activity and 2 or more days of Muscle Strengthening Activity.  Benefits of exercise are limitless: helps weight loss/weight maintenance, improves mood and energy, helps with depression and  anxiety, improves sleep, tones and strengthens muscles, improves balance, improves bone density, protects from chronic conditions such as heart disease, high blood pressure and diabetes and so much more. To learn more visit: http://kirby-bean.org/  DIET: Good nutrition starts with a healthy diet of fruits, vegetables, whole grains, and lean protein sources. Drink plenty of water for hydration. Minimize empty calories, sodium, sweets. For more information about dietary recommendations visit: CriticalGas.be and https://www.carpenter-henry.info/  ALCOHOL:  Women should limit their alcohol intake to no more than 7 drinks/beers/glasses of wine (combined, not each!) per week. Moderation of alcohol intake to this level decreases your risk of breast cancer and liver damage.  If you are concerned that you may have a problem, or your friends have told you they are concerned about your drinking, there are many resources to help. A well-known program that is free, effective, and available to all people all over the nation is Alcoholics Anonymous.  Check out this site to learn more: BeverageBargains.co.za   CALCIUM AND VITAMIN D:  Adequate intake of calcium and Vitamin D are recommended for bone health.  You should be getting between 1000-1200 mg of calcium and 800 units of Vitamin D daily between diet and supplements  PAP SMEARS:  Pap smears, to check for cervical cancer or precancers,  have traditionally been done yearly, scientific advances have shown that most women can  have pap smears less often.  However, every woman still should have a physical exam from her gynecologist every year. It will include a breast check, inspection of the vulva and vagina to check for abnormal growths or skin changes, a visual exam of the cervix, and then an exam to evaluate the size and shape of the uterus and ovaries. We will also provide age appropriate advice  regarding health maintenance, like when you should have certain vaccines, screening for sexually transmitted diseases, bone density testing, colonoscopy, mammograms, etc.   MAMMOGRAMS:  All women over 38 years old should have a routine mammogram.   COLON CANCER SCREENING: Now recommend starting at age 80. At this time colonoscopy is not covered for routine screening until 50. There are take home tests that can be done between 45-49.   COLONOSCOPY:  Colonoscopy to screen for colon cancer is recommended for all women at age 38.  We know, you hate the idea of the prep.  We agree, BUT, having colon cancer and not knowing it is worse!!  Colon cancer so often starts as a polyp that can be seen and removed at colonscopy, which can quite literally save your life!  And if your first colonoscopy is normal and you have no family history of colon cancer, most women don't have to have it again for 10 years.  Once every ten years, you can do something that may end up saving your life, right?  We will be happy to help you get it scheduled when you are ready.  Be sure to check your insurance coverage so you understand how much it will cost.  It may be covered as a preventative service at no cost, but you should check your particular policy.      Breast Self-Awareness Breast self-awareness means being familiar with how your breasts look and feel. It involves checking your breasts regularly and reporting any changes to your health care provider. Practicing breast self-awareness is important. A change in your breasts can be a sign of a serious medical problem. Being familiar with how your breasts look and feel allows you to find any problems early, when treatment is more likely to be successful. All women should practice breast self-awareness, including women who have had breast implants. How to do a breast self-exam One way to learn what is normal for your breasts and whether your breasts are changing is to do a breast  self-exam. To do a breast self-exam: Look for Changes  Remove all the clothing above your waist. Stand in front of a mirror in a room with good lighting. Put your hands on your hips. Push your hands firmly downward. Compare your breasts in the mirror. Look for differences between them (asymmetry), such as: Differences in shape. Differences in size. Puckers, dips, and bumps in one breast and not the other. Look at each breast for changes in your skin, such as: Redness. Scaly areas. Look for changes in your nipples, such as: Discharge. Bleeding. Dimpling. Redness. A change in position. Feel for Changes Carefully feel your breasts for lumps and changes. It is best to do this while lying on your back on the floor and again while sitting or standing in the shower or tub with soapy water on your skin. Feel each breast in the following way: Place the arm on the side of the breast you are examining above your head. Feel your breast with the other hand. Start in the nipple area and make  inch (2 cm)  overlapping circles to feel your breast. Use the pads of your three middle fingers to do this. Apply light pressure, then medium pressure, then firm pressure. The light pressure will allow you to feel the tissue closest to the skin. The medium pressure will allow you to feel the tissue that is a little deeper. The firm pressure will allow you to feel the tissue close to the ribs. Continue the overlapping circles, moving downward over the breast until you feel your ribs below your breast. Move one finger-width toward the center of the body. Continue to use the  inch (2 cm) overlapping circles to feel your breast as you move slowly up toward your collarbone. Continue the up and down exam using all three pressures until you reach your armpit.  Write Down What You Find  Write down what is normal for each breast and any changes that you find. Keep a written record with breast changes or normal findings  for each breast. By writing this information down, you do not need to depend only on memory for size, tenderness, or location. Write down where you are in your menstrual cycle, if you are still menstruating. If you are having trouble noticing differences in your breasts, do not get discouraged. With time you will become more familiar with the variations in your breasts and more comfortable with the exam. How often should I examine my breasts? Examine your breasts every month. If you are breastfeeding, the best time to examine your breasts is after a feeding or after using a breast pump. If you menstruate, the best time to examine your breasts is 5-7 days after your period is over. During your period, your breasts are lumpier, and it may be more difficult to notice changes. When should I see my health care provider? See your health care provider if you notice: A change in shape or size of your breasts or nipples. A change in the skin of your breast or nipples, such as a reddened or scaly area. Unusual discharge from your nipples. A lump or thick area that was not there before. Pain in your breasts. Anything that concerns you.

## 2022-07-21 NOTE — Progress Notes (Signed)
52 y.o. G56P2002 Married Black or [place] American Not Hispanic or Latino female here for annual exam. She is on cyclic HRT for vasomotor symptoms. Pt wants to discuss discharge with use of HRT. She would like to go back on low dose OCP's for the next few years.  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 Blurry vision lasts for a couple of hours.  She notices a slight vaginal odor cyclically.  Period Cycle (Days): 28 Period Duration (Days): 4-5 Period Pattern: Regular Menstrual Flow: Moderate Menstrual Control: Tampon, Maxi pad Dysmenorrhea: None  Patient's last menstrual period was 06/30/2022.          Sexually active: Yes.    The current method of family planning is none.    Exercising: Yes.      Gym/ health club routine includes cardio and light weights. Smoker:  no  Health Maintenance: Pap:  04-04-2018 ascus HPV HR neg  History of abnormal Pap:  no MMG:  06/06/22 BI-RADS CAT 1 neg ( care everywhere)  BMD:   n/a Colonoscopy: 12/11/20  (care everywhere), 3 polyps. F/U in 3 years  TDaP:  10/13/15 Gardasil: n/a   reports that she has never smoked. She has never been exposed to tobacco smoke. She has never used smokeless tobacco. She reports that she does not drink alcohol and does not use drugs. Real Psychologist, occupational. Sons are 20 and 16.   Past Medical History:  Diagnosis Date   Hemorrhoid     Past Surgical History:  Procedure Laterality Date   CESAREAN SECTION     x2    Current Outpatient Medications  Medication Sig Dispense Refill   ALPRAZolam (XANAX) 0.25 MG tablet Take by mouth.     Ascorbic Acid (VITA-C PO) Take 1 tablet by mouth daily.     DOTTI 0.05 MG/24HR patch APPLY 1 PATCH TOPICALLY TO SKIN  TWICE WEEKLY 24 patch 0   MAGNESIUM PO Take 1 tablet by mouth daily.     progesterone (PROMETRIUM) 200 MG capsule Take one capsule po daily on days 1-12 of q month 36 capsule 1   vitamin B-12 (CYANOCOBALAMIN) 1000 MCG tablet Take by mouth.      VITAMIN D PO Take 1 tablet by mouth daily.     Vitamin D-Vitamin K (K2 PLUS D3) [phone] MCG-UNIT TABS Take by mouth.     Zinc 10 MG LOZG Use as directed in the mouth or throat daily.     No current facility-administered medications for this visit.    Family History  Problem Relation Age of Onset   Hypertension Mother    Diabetes Mother        type 1   Cancer Paternal Aunt        COLON CANCER   Diabetes Maternal Grandmother    Cancer Paternal Grandmother        STOMACH CANCER   Heart disease Paternal Grandfather     Review of Systems  All other systems reviewed and are negative. She has had a rash on her chest for a couple of months, not itchy or uncomfortable.   Exam:   BP 112/80 (BP Location: Left Arm, Patient Position: Sitting, Cuff Size: Normal)   Pulse 83   Ht 6' (1.829 m)   Wt 157 lb (71.2 kg)   LMP 06/30/2022   SpO2 99%   BMI 21.29 kg/m   Weight change: @WEIGHTCHANGE @ Height:   Height: 6' (182.9 cm)  Ht Readings from Last 3 Encounters:  07/21/22 6' (1.829 m)  06/28/21 5\' 10"  (1.778 m)  05/25/21 5\' 10"  (1.778 m)    General appearance: alert, cooperative and appears stated age Head: Normocephalic, without obvious abnormality, atraumatic Neck: no adenopathy, supple, symmetrical, trachea midline and thyroid normal to inspection and palpation Lungs: clear to auscultation bilaterally Cardiovascular: regular rate and rhythm Breasts: normal appearance, no masses or tenderness Abdomen: soft, non-tender; non distended,  no masses,  no organomegaly Extremities: extremities normal, atraumatic, no cyanosis or edema Skin: Skin color, texture, turgor normal. She has a rash on her anterior chest wall, several small round, pigmented patches of skin Lymph nodes: Cervical, supraclavicular, and axillary nodes normal. No abnormal inguinal nodes palpated Neurologic: Grossly normal   Pelvic: External genitalia:  no lesions              Urethra:  normal appearing urethra with  no masses, tenderness or lesions              Bartholins and Skenes: normal                 Vagina: normal appearing vagina with normal color, slight increase in thin and thick white vaginal discharge              Cervix: no lesions               Bimanual Exam:  Uterus:  normal size, contour, position, consistency, mobility, non-tender              Adnexa: no mass, fullness, tenderness               Rectovaginal: Confirms               Anus:  normal sphincter tone, no lesions  Kristin Bruins, CMA chaperoned for the exam.  1. Well woman exam Discussed breast self exam Discussed calcium and vit D intake Mammogram & Colonoscopy are UTD Labs with primary  2. Screening for cervical cancer - Cytology - PAP  3. Hormone replacement therapy (HRT) Given her h/o ocular migraines, I would not recommend she go back on OCP's. She can continue on HRT - estradiol (DOTTI) 0.05 MG/24HR patch; APPLY 1 PATCH TOPICALLY TO SKIN  TWICE WEEKLY  Dispense: 24 patch; Refill: 3 - progesterone (PROMETRIUM) 200 MG capsule; Take one capsule po daily on days 1-12 of q month  Dispense: 36 capsule; Refill: 3  4. Vaginal odor - WET PREP FOR TRICH, YEAST, CLUE  5. Tinea corporis Treat with OTC Lotrimin  6. Yeast vaginitis - fluconazole (DIFLUCAN) 150 MG tablet; Take 1 tablet (150 mg total) by mouth once for 1 dose. Take one tablet.  Repeat in 72 hours if symptoms are not completely resolved.  Dispense: 2 tablet; Refill: 0  7. BV (bacterial vaginosis) - metroNIDAZOLE (FLAGYL) 500 MG tablet; Take 1 tablet (500 mg total) by mouth 2 (two) times daily.  Dispense: 14 tablet; Refill: 0

## 2022-07-22 ENCOUNTER — Encounter: Payer: Self-pay | Admitting: Obstetrics and Gynecology

## 2022-07-27 LAB — CYTOLOGY - PAP
Comment: NEGATIVE
Diagnosis: NEGATIVE
Diagnosis: REACTIVE
High risk HPV: NEGATIVE

## 2023-01-24 ENCOUNTER — Telehealth (HOSPITAL_BASED_OUTPATIENT_CLINIC_OR_DEPARTMENT_OTHER): Payer: Self-pay | Admitting: *Deleted

## 2023-01-24 NOTE — Telephone Encounter (Addendum)
Dr Duke Salvia asked that get patient an appointment for near snycope Per  Dr Duke Salvia ok to see patient 12/3 at 10:20 am  Left message to call back

## 2023-01-30 NOTE — Telephone Encounter (Signed)
Never heard back from patient, mychart message sent to patient

## 2023-02-07 ENCOUNTER — Encounter (HOSPITAL_BASED_OUTPATIENT_CLINIC_OR_DEPARTMENT_OTHER): Payer: Self-pay | Admitting: Cardiovascular Disease

## 2023-02-07 ENCOUNTER — Ambulatory Visit (HOSPITAL_BASED_OUTPATIENT_CLINIC_OR_DEPARTMENT_OTHER): Payer: BC Managed Care – PPO | Admitting: Cardiovascular Disease

## 2023-02-07 VITALS — BP 97/67 | HR 86 | Ht 70.0 in | Wt 158.9 lb

## 2023-02-07 DIAGNOSIS — R55 Syncope and collapse: Secondary | ICD-10-CM | POA: Diagnosis not present

## 2023-02-07 NOTE — Progress Notes (Signed)
Cardiology Office Note:  .   Date:  02/07/2023  ID:  Natalie Meyer, DOB 1970-09-14, MRN [id number] PCP: Macy Mis, MD  [facility] Providers Cardiologist:  None    History of Present Illness: .    Natalie Meyer is a 52 y.o. female who is being seen today for the evaluation of near syncope at the request of Macy Mis, MD.    Natalie Meyer presents with intermittent episodes of lightheadedness and dizziness. These episodes began in June, following a trip to [place], and have been occurring sporadically since then. The patient describes the sensation as a sudden feeling of the room spinning, accompanied by lightheadedness, but does not lose consciousness. These episodes typically occur while standing or walking, and can happen multiple times in a day, followed by periods of several days without any episodes.  Natalie Meyer sought care from her primary care provider, who conducted lab work that returned normal results, including a diabetes test due to a family history of the condition. The patient is currently on hormone replacement therapy (HRT) and has been for the past two years without any similar issues. She also takes Miralax daily for constipation and various supplements recommended by a homeopathic doctor, including berberine, magnesium oxide, and vitamin D.  She maintains an active lifestyle, working out three to four days a week, primarily strength training. She reports no issues such as dizziness or lightheadedness during her workouts. She also reports occasional "catches" of breath, described as a fluttering sensation, but these are infrequent and not associated with the dizziness episodes.  Her diet has not changed recently, and she consumes moderate amounts of water daily. She has no history of high blood pressure and no known heart disease or stroke in her family. The patient's episodes of lightheadedness and dizziness continue to occur, causing concern due  to their frequency and unpredictability.     ROS:  As per HPI  Studies Reviewed: Marland Kitchen   EKG Interpretation Date/Time:  Tuesday February 07 2023 10:50:40 EST Ventricular Rate:  86 PR Interval:  160 QRS Duration:  82 QT Interval:  360 QTC Calculation: 430 R Axis:   46  Text Interpretation: Normal sinus rhythm Low voltage QRS No previous ECGs available Confirmed by Chilton Si ([id number]) on 02/07/2023 10:52:45 AM    Risk Assessment/Calculations:             Physical Exam:   VS:  BP 97/67 (BP Location: Left Arm, Patient Position: Sitting, Cuff Size: Normal) Comment: 118/77   115/75  Pulse 86   Ht 5\' 10"  (1.778 m)   Wt 158 lb 14.4 oz (72.1 kg)   BMI 22.80 kg/m  , BMI Body mass index is 22.8 kg/m. GENERAL:  Well appearing HEENT: Pupils equal round and reactive, fundi not visualized, oral mucosa unremarkable NECK:  No jugular venous distention, waveform within normal limits, carotid upstroke brisk and symmetric, no bruits, no thyromegaly LUNGS:  Clear to auscultation bilaterally HEART:  RRR.  PMI not displaced or sustained,S1 and S2 within normal limits, no S3, no S4, no clicks, no rubs, no murmurs ABD:  Flat, positive bowel sounds normal in frequency in pitch, no bruits, no rebound, no guarding, no midline pulsatile mass, no hepatomegaly, no splenomegaly EXT:  2 plus pulses throughout, no edema, no cyanosis no clubbing SKIN:  No rashes no nodules NEURO:  Cranial nerves II through XII grossly intact, motor grossly intact throughout PSYCH:  Cognitively intact, oriented to person place and time  ASSESSMENT AND PLAN: .    # Recurrent Dizziness Episodes of lightheadedness and near syncope since June, typically when standing. No associated palpitations, chest pain, or shortness of breath. No orthostatic hypotension observed during office visit, though her symptoms seem like classic orthostatic hypotension. -Order 30-day heart monitor to rule out arrhythmia. -Advise patient to press  button on monitor during episodes to correlate symptoms with rhythm. -Order echocardiogram to assess cardiac structure and function. -Encourage increased hydration, compression socks and liberalized sodium consumption  # Venous Insufficiency Patient reports having "really bad" venous insufficiency. -Recommend use of compression socks to prevent blood pooling in legs and promote blood flow to brain.  # Hormone Replacement Therapy (HRT) Patient has been on HRT for two years. Unclear if current estrogen levels are therapeutic. -Advise patient to check estrogen levels with OB/GYN.  # Exercise Patient reports doing mostly strength training 3-4 days a week. -Recommend increasing cardio exercise to at least 150 minutes per week.  #Follow-up Plan to review results of heart monitor and echocardiogram. -Schedule follow-up appointment in a few months to assess response to interventions.       Signed, Chilton Si, MD

## 2023-02-07 NOTE — Patient Instructions (Addendum)
Medication Instructions:  Your physician recommends that you continue on your current medications as directed. Please refer to the Current Medication list given to you today.  *If you need a refill on your cardiac medications before your next appointment, please call your pharmacy*  Lab Work: NONE  Testing/Procedures: Your physician has requested that you have an echocardiogram. Echocardiography is a painless test that uses sound waves to create images of your heart. It provides your doctor with information about the size and shape of your heart and how well your heart's chambers and valves are working. This procedure takes approximately one hour. There are no restrictions for this procedure. Please do NOT wear cologne, perfume, aftershave, or lotions (deodorant is allowed). Please arrive 15 minutes prior to your appointment time.  Please note: We ask at that you not bring children with you during ultrasound (echo/ vascular) testing. Due to room size and safety concerns, children are not allowed in the ultrasound rooms during exams. Our front office staff cannot provide observation of children in our lobby area while testing is being conducted. An adult accompanying a patient to their appointment will only be allowed in the ultrasound room at the discretion of the ultrasound technician under special circumstances. We apologize for any inconvenience.  Your physician has recommended that you wear an event monitor. Event monitors are medical devices that record the heart's electrical activity. Doctors most often [place] these monitors to diagnose arrhythmias. Arrhythmias are problems with the speed or rhythm of the heartbeat. The monitor is a small, portable device. You can wear one while you do your normal daily activities. This is usually used to diagnose what is causing palpitations/syncope (passing out). THIS WILL BE MAILED TO YOU   Follow-Up: 04/25/2022 10:00 AM WITH DR [facility]   [employer] Cardiac  Event Monitor Instructions  Your physician has requested you wear your cardiac event monitor for _30_ days, (1-30). [employer] may call or text to confirm a shipping address. The monitor will be sent to a land address via UPS. [employer] will not ship a monitor to a PO BOX. It typically takes 3-5 days to receive your monitor after it has been enrolled. [employer] will assist with USPS tracking if your package is delayed. The telephone number for [employer] is [phone]. Once you have received your monitor, please review the enclosed instructions. Instruction tutorials can also be viewed under help and settings on the enclosed cell phone. Your monitor has already been registered assigning a specific monitor serial # to you.  Billing and Self Pay Discount Information  [employer] has been provided the insurance information we had on file for you.  If your insurance has been updated, please call [employer] at [phone] to provide them with your updated insurance information.   [employer] offers a discounted Self Pay option for patients who have insurance that does not cover their cardiac event monitor or patients without insurance.  The discounted cost of a Self Pay Cardiac Event Monitor would be $225.00 , if the patient contacts [employer] at [phone] within 7 days of applying the monitor to make payment arrangements.  If the patient does not contact [employer] within 7 days of applying the monitor, the cost of the cardiac event monitor will be $350.00.  Applying the monitor  Remove cell phone from case and turn it on. The cell phone works as IT consultant and needs to be within [employer] of you at all times. The cell phone will need to be charged on a daily basis. We  recommend you plug the cell phone into the enclosed charger at your bedside table every night.  Monitor batteries: You will receive two monitor batteries labelled #1 and #2. These are your recorders. Plug battery #2  onto the second connection on the enclosed charger. Keep one battery on the charger at all times. This will keep the monitor battery deactivated. It will also keep it fully charged for when you need to switch your monitor batteries. A small light will be blinking on the battery emblem when it is charging. The light on the battery emblem will remain on when the battery is fully charged.  Open package of a Monitor strip. Insert battery #1 into black hood on strip and gently squeeze monitor battery onto connection as indicated in instruction booklet. Set aside while preparing skin.  Choose location for your strip, vertical or horizontal, as indicated in the instruction booklet. Shave to remove all hair from location. There cannot be any lotions, oils, powders, or colognes on skin where monitor is to be applied. Wipe skin clean with enclosed Saline wipe. Dry skin completely.  Peel paper labeled #1 off the back of the Monitor strip exposing the adhesive. Place the monitor on the chest in the vertical or horizontal position shown in the instruction booklet. One arrow on the monitor strip must be pointing upward. Carefully remove paper labeled #2, attaching remainder of strip to your skin. Try not to create any folds or wrinkles in the strip as you apply it.  Firmly press and release the circle in the center of the monitor battery. You will hear a small beep. This is turning the monitor battery on. The heart emblem on the monitor battery will light up every 5 seconds if the monitor battery in turned on and connected to the patient securely. Do not push and hold the circle down as this turns the monitor battery off. The cell phone will locate the monitor battery. A screen will appear on the cell phone checking the connection of your monitor strip. This may read poor connection initially but change to good connection within the next minute. Once your monitor accepts the connection you will hear a series  of 3 beeps followed by a climbing crescendo of beeps. A screen will appear on the cell phone showing the two monitor strip placement options. Touch the picture that demonstrates where you applied the monitor strip.  Your monitor strip and battery are waterproof. You are able to shower, bathe, or swim with the monitor on. They just ask you do not submerge deeper than 3 feet underwater. We recommend removing the monitor if you are swimming in a lake, river, or ocean.  Your monitor battery will need to be switched to a fully charged monitor battery approximately once a week. The cell phone will alert you of an action which needs to be made.  On the cell phone, tap for details to reveal connection status, monitor battery status, and cell phone battery status. The green dots indicates your monitor is in good status. A red dot indicates there is something that needs your attention.  To record a symptom, click the circle on the monitor battery. In 30-60 seconds a list of symptoms will appear on the cell phone. Select your symptom and tap save. Your monitor will record a sustained or significant arrhythmia regardless of you clicking the button. Some patients do not feel the heart rhythm irregularities. [employer] will notify us of any serious or critical events.  Refer to instruction  booklet for instructions on switching batteries, changing strips, the Do not disturb or Pause features, or any additional questions.  Call [employer] at [phone], to confirm your monitor is transmitting and record your baseline. They will answer any questions you may have regarding the monitor instructions at that time.  Returning the monitor to [employer]  Place all equipment back into blue box. Peel off strip of paper to expose adhesive and close box securely. There is a prepaid UPS shipping label on this box. Drop in a UPS drop box, or at a UPS facility like [employer]. You may also contact [employer] to  arrange UPS to pick up monitor package at your home.

## 2023-02-14 ENCOUNTER — Encounter (HOSPITAL_BASED_OUTPATIENT_CLINIC_OR_DEPARTMENT_OTHER): Payer: Self-pay | Admitting: Cardiovascular Disease

## 2023-02-17 DIAGNOSIS — R55 Syncope and collapse: Secondary | ICD-10-CM | POA: Diagnosis not present

## 2023-03-06 ENCOUNTER — Ambulatory Visit (INDEPENDENT_AMBULATORY_CARE_PROVIDER_SITE_OTHER): Payer: BC Managed Care – PPO

## 2023-03-06 DIAGNOSIS — R55 Syncope and collapse: Secondary | ICD-10-CM

## 2023-03-06 LAB — ECHOCARDIOGRAM COMPLETE
Area-P 1/2: 4.29 cm2
Calc EF: 55.3 %
S' Lateral: 2.33 cm
Single Plane A2C EF: 60.7 %
Single Plane A4C EF: 48 %

## 2023-03-21 ENCOUNTER — Ambulatory Visit: Payer: BC Managed Care – PPO | Attending: Cardiovascular Disease

## 2023-03-21 DIAGNOSIS — R55 Syncope and collapse: Secondary | ICD-10-CM

## 2023-03-27 ENCOUNTER — Encounter (HOSPITAL_BASED_OUTPATIENT_CLINIC_OR_DEPARTMENT_OTHER): Payer: Self-pay | Admitting: Cardiovascular Disease

## 2023-04-26 ENCOUNTER — Ambulatory Visit (HOSPITAL_BASED_OUTPATIENT_CLINIC_OR_DEPARTMENT_OTHER): Payer: BC Managed Care – PPO | Admitting: Cardiovascular Disease

## 2023-05-03 ENCOUNTER — Encounter (HOSPITAL_BASED_OUTPATIENT_CLINIC_OR_DEPARTMENT_OTHER): Payer: Self-pay | Admitting: Cardiovascular Disease

## 2023-05-03 ENCOUNTER — Ambulatory Visit (HOSPITAL_BASED_OUTPATIENT_CLINIC_OR_DEPARTMENT_OTHER): Payer: BC Managed Care – PPO | Admitting: Cardiovascular Disease

## 2023-05-03 VITALS — BP 90/60 | HR 100 | Ht 70.0 in | Wt 153.0 lb

## 2023-05-03 DIAGNOSIS — R002 Palpitations: Secondary | ICD-10-CM | POA: Diagnosis not present

## 2023-05-03 HISTORY — DX: Palpitations: R00.2

## 2023-05-03 NOTE — Progress Notes (Signed)
 Cardiology Office Note:  .   Date:  05/03/2023  ID:  Natalie Meyer, DOB [birth year], MRN [id number] PCP: Macy Mis, MD  [facility] Providers Cardiologist:  None    History of Present Illness: .    Natalie Meyer is a 53 y.o. female with syncope here for follow-up.  Natalie Meyer presented with intermittent episodes of lightheadedness and dizziness. These episodes began in June 2024, following a trip to [place], and have been occurring sporadically since then. The patient describes the sensation as a sudden feeling of the room spinning, accompanied by lightheadedness, but does not lose consciousness. These episodes typically occur while standing or walking, and can happen multiple times in a day, followed by periods of several days without any episodes.   Natalie Meyer sought care from her primary care provider, who conducted lab work that returned normal results.  Echo 02/2023 revealed LVEF 60-65% with normal diastolic function.    Natalie Meyer experiences palpitations described as a 'fluttering' sensation, occurring monthly around the start of her menstrual cycle. These episodes last 5 to 10 seconds and resolve spontaneously. No episodes of syncope or near syncope accompany these palpitations. Her heart rate is slightly elevated during these episodes, but her rhythm remains normal.  Lightheadedness is noted, particularly in association with her menstrual cycle. No significant changes in symptoms with variations in hydration levels. No swelling is reported.  She denies orthostatic symptoms.   Previous diagnostic evaluations, including scans and an echocardiogram, have returned normal results.     ROS:  As per HPI  Studies Reviewed: .       Echo 03/06/2023: 1. Left ventricular ejection fraction, by estimation, is 60 to 65%. The  left ventricle has normal function. The left ventricle has no regional  wall motion abnormalities. Left ventricular diastolic  parameters were  normal.   2. Right ventricular systolic function is normal. The right ventricular  size is normal. There is normal pulmonary artery systolic pressure. The  estimated right ventricular systolic pressure is 23.6 mmHg.   3. The mitral valve is grossly normal. Trivial mitral valve  regurgitation. No evidence of mitral stenosis.   4. The aortic valve is tricuspid. Aortic valve regurgitation is not  visualized. No aortic stenosis is present.   5. The inferior vena cava is normal in size with greater than 50%  respiratory variability, suggesting right atrial pressure of 3 mmHg.   Risk Assessment/Calculations:             Physical Exam:   VS:  BP 90/60 (BP Location: Left Arm, Patient Position: Sitting, Cuff Size: Normal)   Pulse 100   Ht 5\' 10"  (1.778 m)   Wt 153 lb (69.4 kg)   BMI 21.95 kg/m  , BMI Body mass index is 21.95 kg/m. GENERAL:  Well appearing HEENT: Pupils equal round and reactive, fundi not visualized, oral mucosa unremarkable NECK:  No jugular venous distention, waveform within normal limits, carotid upstroke brisk and symmetric, no bruits, no thyromegaly LUNGS:  Clear to auscultation bilaterally HEART:  RRR.  PMI not displaced or sustained,S1 and S2 within normal limits, no S3, no S4, no clicks, no rubs, no murmurs ABD:  Flat, positive bowel sounds normal in frequency in pitch, no bruits, no rebound, no guarding, no midline pulsatile mass, no hepatomegaly, no splenomegaly EXT:  2 plus pulses throughout, no edema, no cyanosis no clubbing SKIN:  No rashes no nodules NEURO:  Cranial nerves II through XII grossly intact, motor grossly intact  throughout [facility]:  Cognitively intact, oriented to person place and time   ASSESSMENT AND PLAN: .    # Palpitations Monthly episodes of palpitations lasting 5-10 seconds, possibly related to hormonal changes. No syncope. Normal sinus rhythm on monitor with occasional sinus tachycardia and brief runs of extra beats.  Normal echocardiogram. -Continue hydration and consider increased salt intake to maintain blood pressure and reduce symptoms. -As needed follow-up unless symptoms worsen or new symptoms develop.      Dispo: Follow up as needed.  Signed, Chilton Si, MD

## 2023-05-03 NOTE — Patient Instructions (Signed)
 Medication Instructions:  .Your physician recommends that you continue on your current medications as directed. Please refer to the Current Medication list given to you today.  Labwork: none  Testing/Procedures: none  Follow-Up: As needed

## 2023-08-07 ENCOUNTER — Encounter: Payer: Self-pay | Admitting: Obstetrics and Gynecology

## 2023-08-07 ENCOUNTER — Ambulatory Visit: Admitting: Obstetrics and Gynecology

## 2023-08-07 VITALS — BP 110/74 | HR 72 | Resp 16 | Ht 70.75 in | Wt 153.0 lb

## 2023-08-07 DIAGNOSIS — Z79890 Hormone replacement therapy (postmenopausal): Secondary | ICD-10-CM | POA: Diagnosis not present

## 2023-08-07 DIAGNOSIS — Z01419 Encounter for gynecological examination (general) (routine) without abnormal findings: Secondary | ICD-10-CM | POA: Diagnosis not present

## 2023-08-07 DIAGNOSIS — Z1211 Encounter for screening for malignant neoplasm of colon: Secondary | ICD-10-CM

## 2023-08-07 DIAGNOSIS — Z1331 Encounter for screening for depression: Secondary | ICD-10-CM | POA: Diagnosis not present

## 2023-08-07 DIAGNOSIS — N9419 Other specified dyspareunia: Secondary | ICD-10-CM

## 2023-08-07 DIAGNOSIS — E2839 Other primary ovarian failure: Secondary | ICD-10-CM

## 2023-08-07 MED ORDER — INTRAROSA 6.5 MG VA INST: 1.0000 | VAGINAL_INSERT | Freq: Every evening | VAGINAL | 12 refills | Status: AC | PRN

## 2023-08-07 MED ORDER — PROGESTERONE 200 MG PO CAPS: ORAL_CAPSULE | ORAL | 6 refills | Status: DC

## 2023-08-07 MED ORDER — ESTRADIOL 0.05 MG/24HR TD PTTW: MEDICATED_PATCH | TRANSDERMAL | 3 refills | Status: DC

## 2023-08-07 NOTE — Progress Notes (Unsigned)
 53 y.o. y.o. female here for annual exam. Patient's last menstrual period was 07/31/2023 (exact date). Period Duration (Days): 5 Period Pattern: Regular Menstrual Flow: Moderate, Light Menstrual Control: Maxi pad Dysmenorrhea: None Reports decreased libido and dyspareunia   G2P2002 Married Black or [place] American Not Hispanic or Latino female here for annual exam. She is on cyclic HRT for vasomotor symptoms.  Period Cycle (Days): 28 Period Duration (Days): 4-5 Period Pattern: Regular Menstrual Flow: Moderate Menstrual Control: Tampon, Maxi pad Dysmenorrhea: None  Patient's last menstrual period was 06/30/2022.          Sexually active: Yes.    The current method of family planning is none.    Exercising: Yes.     Gym/ health club routine includes cardio and light weights. Smoker:  no  Health Maintenance: Pap:  04-04-2018 ascus HPV HR neg  History of abnormal Pap:  no MMG:  06/06/22 BI-RADS CAT 1 neg ( care everywhere)  BMD:   n/a Colonoscopy: 12/11/20  (care everywhere), 3 polyps. F/U in 3 years  TDaP:  10/13/15 Gardasil: n/a Body mass index is 21.49 kg/m.  PUS 2022 Study Result  Narrative & Impression  Pelvic ultrasound   Indications: bleeding on HRT   Findings:   Anteverted Uterus 8.67 x 5.86 x 4.61 cm   Endometrium 2.79 mm, trace amount of fluid in endometrial cavity (patient is bleeding)   Left ovary 3.43 x 2.39 x 1.70 cm  1.42 x 1.46 cm follicle   Right ovary 1.90 x 1.04 x 0.78 cm Atrophic   Trace free fluid   Impression: Normal sized anteverted uterus Thin endometrium with a small amount of fluid in the endometrial cavity Normal ovaries bilaterally       08/07/2023    3:10 PM  Depression screen PHQ 2/9  Decreased Interest 0  Down, Depressed, Hopeless 0  PHQ - 2 Score 0    Blood pressure 110/74, pulse 72, resp. rate 16, height 5' 10.75" (1.797 m), weight 153 lb (69.4 kg), last menstrual period 07/31/2023.     Component Value Date/Time    DIAGPAP  07/21/2022 1552    - Negative for Intraepithelial Lesions or Malignancy (NILM)   DIAGPAP - Benign reactive/reparative changes 07/21/2022 1552   DIAGPAP (A) 06/28/2021 1434    - Atypical squamous cells of undetermined significance (ASC-US )   HPVHIGH Negative 07/21/2022 1552   HPVHIGH Negative 06/28/2021 1434   ADEQPAP  07/21/2022 1552    Satisfactory for evaluation; transformation zone component PRESENT.   ADEQPAP  06/28/2021 1434    Satisfactory for evaluation; transformation zone component ABSENT.    GYN HISTORY:    Component Value Date/Time   DIAGPAP  07/21/2022 1552    - Negative for Intraepithelial Lesions or Malignancy (NILM)   DIAGPAP - Benign reactive/reparative changes 07/21/2022 1552   DIAGPAP (A) 06/28/2021 1434    - Atypical squamous cells of undetermined significance (ASC-US )   HPVHIGH Negative 07/21/2022 1552   HPVHIGH Negative 06/28/2021 1434   ADEQPAP  07/21/2022 1552    Satisfactory for evaluation; transformation zone component PRESENT.   ADEQPAP  06/28/2021 1434    Satisfactory for evaluation; transformation zone component ABSENT.    OB History  Gravida Para Term Preterm AB Living  2 2 2   2   SAB IAB Ectopic Multiple Live Births          # Outcome Date GA Lbr Len/2nd Weight Sex Type Anes PTL Lv  2 Term  1 Term             Past Medical History:  Diagnosis Date   Anxiety    Chronic headaches    Hemorrhoid    Palpitations 05/03/2023   Rectal fistula     Past Surgical History:  Procedure Laterality Date   CESAREAN SECTION     x2    Current Outpatient Medications on File Prior to Visit  Medication Sig Dispense Refill   Ascorbic Acid (VITA-C PO) Take 1 tablet by mouth daily.     Biotin 62130 MCG TABS Take 10,000 mg by mouth daily.     co-enzyme Q-10 30 MG capsule Take 100 mg by mouth daily.     estradiol  (DOTTI ) 0.05 MG/24HR patch APPLY 1 PATCH TOPICALLY TO SKIN  TWICE WEEKLY 24 patch 3   MAGNESIUM PO Take 1 tablet by mouth  daily.     progesterone  (PROMETRIUM ) 200 MG capsule Take one capsule po daily on days 1-12 of q month 36 capsule 3   UNABLE TO FIND Med Name: berberine po     vitamin B-12 (CYANOCOBALAMIN) 1000 MCG tablet Take by mouth.     Vitamin D -Vitamin K (K2 PLUS D3) 5123522201 MCG-UNIT TABS Take by mouth.     Zinc 10 MG LOZG Use as directed in the mouth or throat daily.     No current facility-administered medications on file prior to visit.    Social History   Socioeconomic History   Marital status: Married    Spouse name: Not on file   Number of children: Not on file   Years of education: Not on file   Highest education level: Not on file  Occupational History   Not on file  Tobacco Use   Smoking status: Never    Passive exposure: Never   Smokeless tobacco: Never  Vaping Use   Vaping status: Never Used  Substance and Sexual Activity   Alcohol use: No    Alcohol/week: 0.0 standard drinks of alcohol   Drug use: No   Sexual activity: Yes    Partners: Male    Birth control/protection: Other-see comments    Comment: first sexual encounter at age 40, less than five, husband vasectomy  Other Topics Concern   Not on file  Social History Narrative   Not on file   Social Drivers of Health   Financial Resource Strain: Low Risk  (07/28/2023)   Received from [facility]   Overall Financial Resource Strain (CARDIA)    Difficulty of Paying Living Expenses: Not very hard  Food Insecurity: No Food Insecurity (07/28/2023)   Received from [facility]   Hunger Vital Sign    Worried About Running Out of Food in the Last Year: Never true    Ran Out of Food in the Last Year: Never true  Transportation Needs: No Transportation Needs (07/28/2023)   [facility] - Transportation    Lack of Transportation (Medical): No    Lack of Transportation (Non-Medical): No  Physical Activity: Sufficiently Active (10/31/2022)   Received from [facility]   Exercise Vital Sign     Days of Exercise per Week: 3 days    Minutes of Exercise per Session: 60 min  Stress: No Stress Concern Present (10/31/2022)   Received from [facility] - Occupational Stress Questionnaire    Feeling of Stress : Only a little  Social Connections: Socially Integrated (10/31/2022)   Received from [facility]  Social Network    How would you rate your social network (family, work, friends)?: Good participation with social networks  Intimate Partner Violence: Not At Risk (10/31/2022)   Received from [facility]   HITS    Over the last 12 months how often did your partner physically hurt you?: Never    Over the last 12 months how often did your partner insult you or talk down to you?: Never    Over the last 12 months how often did your partner threaten you with physical harm?: Never    Over the last 12 months how often did your partner scream or curse at you?: Never    Family History  Problem Relation Age of Onset   Hypertension Mother    Diabetes Mother        type 1   Diabetes Maternal Aunt    Diabetes Maternal Uncle        type 1   Cancer Paternal Aunt        COLON CANCER   Diabetes Maternal Grandmother    Cancer Paternal Grandmother        STOMACH CANCER   Heart disease Paternal Grandfather      No Known Allergies    Patient's last menstrual period was Patient's last menstrual period was 07/31/2023 (exact date)..             Review of Systems Alls systems reviewed and are negative.     Physical Exam Constitutional:      Appearance: Normal appearance.  Genitourinary:     Vulva and urethral meatus normal.     No lesions in the vagina.     Right Labia: No rash, lesions or skin changes.    Left Labia: No lesions, skin changes or rash.    No vaginal discharge or tenderness.     No vaginal prolapse present.    No vaginal atrophy present.     Right Adnexa: not tender, not palpable and no mass present.    Left Adnexa:  not tender, not palpable and no mass present.    No cervical motion tenderness or discharge.     Uterus is not enlarged, tender or irregular.  Breasts:    Right: Normal.     Left: Normal.  HENT:     Head: Normocephalic.  Neck:     Thyroid: No thyroid mass, thyromegaly or thyroid tenderness.  Cardiovascular:     Rate and Rhythm: Normal rate and regular rhythm.     Heart sounds: Normal heart sounds, S1 normal and S2 normal.  Pulmonary:     Effort: Pulmonary effort is normal.     Breath sounds: Normal breath sounds and air entry.  Abdominal:     General: There is no distension.     Palpations: Abdomen is soft. There is no mass.     Tenderness: There is no abdominal tenderness. There is no guarding or rebound.  Musculoskeletal:        General: Normal range of motion.     Cervical back: Full passive range of motion without pain, normal range of motion and neck supple. No tenderness.     Right lower leg: No edema.     Left lower leg: No edema.  Neurological:     Mental Status: She is alert.  Skin:    General: Skin is warm.  Psychiatric:        Mood and Affect: Mood normal.        Behavior: Behavior normal.  Thought Content: Thought content normal.  Vitals and nursing note reviewed. Exam conducted with a chaperone present.       A:         Well Woman GYN exam                             P:        Pap smear collected today Encouraged annual mammogram screening Colon cancer screening referral placed today DXA ordered today Labs and immunizations to do with PMD Discussed breast self exams Encouraged healthy lifestyle practices Encouraged Vit D and Calcium  TO begin intrarosa for dyspareunia and decreased libido. Brochure was given  No follow-ups on file.  Reinaldo Caras

## 2023-08-08 ENCOUNTER — Ambulatory Visit: Payer: Self-pay | Admitting: Obstetrics and Gynecology

## 2023-08-08 LAB — COMPREHENSIVE METABOLIC PANEL WITH GFR
AG Ratio: 1.8 (calc) (ref 1.0–2.5)
ALT: 8 U/L (ref 6–29)
AST: 13 U/L (ref 10–35)
Albumin: 4.2 g/dL (ref 3.6–5.1)
Alkaline phosphatase (APISO): 65 U/L (ref 37–153)
BUN: 18 mg/dL (ref 7–25)
CO2: 25 mmol/L (ref 20–32)
Calcium: 9.1 mg/dL (ref 8.6–10.4)
Chloride: 105 mmol/L (ref 98–110)
Creat: 1.02 mg/dL (ref 0.50–1.03)
Globulin: 2.4 g/dL (ref 1.9–3.7)
Glucose, Bld: 113 mg/dL — ABNORMAL HIGH (ref 65–99)
Potassium: 4 mmol/L (ref 3.5–5.3)
Sodium: 139 mmol/L (ref 135–146)
Total Bilirubin: 0.4 mg/dL (ref 0.2–1.2)
Total Protein: 6.6 g/dL (ref 6.1–8.1)
eGFR: 67 mL/min/{1.73_m2} (ref >=60)

## 2023-08-08 LAB — CBC
HCT: 38 % (ref 35.0–45.0)
Hemoglobin: 12.5 g/dL (ref 11.7–15.5)
MCH: 29.7 pg (ref 27.0–33.0)
MCHC: 32.9 g/dL (ref 32.0–36.0)
MCV: 90.3 fL (ref 80.0–100.0)
MPV: 11.2 fL (ref 7.5–12.5)
Platelets: 220 10*3/uL (ref 140–400)
RBC: 4.21 10*6/uL (ref 3.80–5.10)
RDW: 12 % (ref 11.0–15.0)
WBC: 7.3 10*3/uL (ref 3.8–10.8)

## 2023-08-08 LAB — LIPID PANEL
Cholesterol: 165 mg/dL (ref <200)
HDL: 76 mg/dL (ref >=50)
LDL Cholesterol (Calc): 77 mg/dL
Non-HDL Cholesterol (Calc): 89 mg/dL (ref <130)
Total CHOL/HDL Ratio: 2.2 (calc) (ref <5.0)
Triglycerides: 42 mg/dL (ref <150)

## 2023-08-08 LAB — FOLLICLE STIMULATING HORMONE: FSH: 105.6 m[IU]/mL

## 2023-08-08 LAB — HEMOGLOBIN A1C
Hgb A1c MFr Bld: 5.7 % — ABNORMAL HIGH (ref <5.7)
Mean Plasma Glucose: 117 mg/dL
eAG (mmol/L): 6.5 mmol/L

## 2023-08-08 LAB — TSH: TSH: 1.28 m[IU]/L

## 2023-08-08 LAB — VITAMIN D 25 HYDROXY (VIT D DEFICIENCY, FRACTURES): Vit D, 25-Hydroxy: 48 ng/mL (ref 30–100)

## 2023-08-11 NOTE — Patient Instructions (Signed)
 Hi Joanette, I also put in an order for a baseline bone scan to check for osteoporosis and osteopenia since we are getting into the menopause point and bone loss is 5% per year.  Thank you for coming in! Dr. Tia Flowers

## 2023-08-14 ENCOUNTER — Other Ambulatory Visit: Payer: Self-pay

## 2023-08-14 DIAGNOSIS — Z79890 Hormone replacement therapy (postmenopausal): Secondary | ICD-10-CM

## 2023-08-14 MED ORDER — ESTRADIOL 0.05 MG/24HR TD PTTW: MEDICATED_PATCH | TRANSDERMAL | 3 refills | Status: AC

## 2023-08-14 MED ORDER — PROGESTERONE 200 MG PO CAPS: ORAL_CAPSULE | ORAL | 0 refills | Status: DC

## 2023-08-14 MED ORDER — PROGESTERONE 200 MG PO CAPS: ORAL_CAPSULE | ORAL | 3 refills | Status: DC

## 2023-08-14 NOTE — Addendum Note (Signed)
 Addended by: Maurie Olesen D on: 08/14/2023 01:48 PM   Modules accepted: Orders

## 2023-08-14 NOTE — Addendum Note (Signed)
 Addended by: Reinaldo Caras on: 08/14/2023 08:31 PM   Modules accepted: Level of Service

## 2023-08-14 NOTE — Telephone Encounter (Signed)
 Pt LVM in triage line stating that she needs refills on progesterone  sent to mail order and local pharmacy. Due to start back on progesterone  on 6/15.   Per EMR review: rxs all sent from appt on 6/2 to specialty mail-order pharm.   Med refill request: HRT Last AEX: 08/07/2023-EB Next AEX: nothing currently. Recall in EMR. Last MMG (if hormonal med): 06/06/2022-WNL (Care everywhere) Refill authorized: rxs pend.   Spoke w/ the pt and confirmed that just needs an initial rx for progesterone  only sent to [facility]. However, remaining refills of progesterone  and whole [facility] for patches [facility]   Please advise if ok to send?

## 2023-11-09 ENCOUNTER — Other Ambulatory Visit: Payer: Self-pay | Admitting: Obstetrics and Gynecology

## 2023-11-09 DIAGNOSIS — Z79890 Hormone replacement therapy (postmenopausal): Secondary | ICD-10-CM

## 2023-11-09 NOTE — Telephone Encounter (Signed)
 Med refill request: progesterone  200 Last AEX:  08/07/23 Next AEX: not scheduled  Last MMG (if hormonal med) 06/06/22 birads cat 1 neg  Refill authorized: last rx 08/14/23 #36 with 3 refills. Please approve or deny
